# Patient Record
Sex: Female | Born: 1975 | Race: Black or African American | Hispanic: No | Marital: Single | Smoking: Never smoker
Health system: Southern US, Community
[De-identification: ages and names within clinical notes are randomized; demographics above are authoritative.]

## PROBLEM LIST (undated history)

## (undated) DIAGNOSIS — G4486 Cervicogenic headache: Secondary | ICD-10-CM

## (undated) DIAGNOSIS — F419 Anxiety disorder, unspecified: Secondary | ICD-10-CM

## (undated) DIAGNOSIS — K219 Gastro-esophageal reflux disease without esophagitis: Secondary | ICD-10-CM

## (undated) DIAGNOSIS — T7840XA Allergy, unspecified, initial encounter: Secondary | ICD-10-CM

## (undated) DIAGNOSIS — D649 Anemia, unspecified: Secondary | ICD-10-CM

## (undated) DIAGNOSIS — S134XXA Sprain of ligaments of cervical spine, initial encounter: Secondary | ICD-10-CM

## (undated) DIAGNOSIS — R51 Headache: Secondary | ICD-10-CM

## (undated) HISTORY — DX: Headache: R51

## (undated) HISTORY — DX: Allergy, unspecified, initial encounter: T78.40XA

## (undated) HISTORY — DX: Gastro-esophageal reflux disease without esophagitis: K21.9

## (undated) HISTORY — PX: MOUTH SURGERY: SHX715

## (undated) HISTORY — DX: Cervicogenic headache: G44.86

## (undated) HISTORY — DX: Anemia, unspecified: D64.9

## (undated) HISTORY — DX: Anxiety disorder, unspecified: F41.9

## (undated) HISTORY — DX: Sprain of ligaments of cervical spine, initial encounter: S13.4XXA

---

## 1999-08-19 ENCOUNTER — Other Ambulatory Visit: Admission: RE | Admit: 1999-08-19 | Discharge: 1999-08-19 | Payer: Self-pay | Admitting: Obstetrics

## 2008-03-23 ENCOUNTER — Emergency Department (HOSPITAL_COMMUNITY): Admission: EM | Admit: 2008-03-23 | Discharge: 2008-03-23 | Payer: Self-pay | Admitting: Family Medicine

## 2011-05-15 ENCOUNTER — Emergency Department (HOSPITAL_COMMUNITY)
Admission: EM | Admit: 2011-05-15 | Discharge: 2011-05-15 | Disposition: A | Discharge status: Home / Self Care | Payer: No Typology Code available for payment source | Attending: Emergency Medicine | Admitting: Emergency Medicine

## 2011-05-15 ENCOUNTER — Encounter (HOSPITAL_COMMUNITY): Payer: Self-pay | Admitting: Emergency Medicine

## 2011-05-15 DIAGNOSIS — T148XXA Other injury of unspecified body region, initial encounter: Secondary | ICD-10-CM

## 2011-05-15 DIAGNOSIS — S139XXA Sprain of joints and ligaments of unspecified parts of neck, initial encounter: Secondary | ICD-10-CM | POA: Insufficient documentation

## 2011-05-15 DIAGNOSIS — Y93I9 Activity, other involving external motion: Secondary | ICD-10-CM | POA: Insufficient documentation

## 2011-05-15 DIAGNOSIS — Y998 Other external cause status: Secondary | ICD-10-CM | POA: Insufficient documentation

## 2011-05-15 MED ORDER — IBUPROFEN 800 MG PO TABS: 800.0000 mg | ORAL_TABLET | Freq: Three times a day (TID) | ORAL | Status: AC

## 2011-05-15 MED ORDER — CYCLOBENZAPRINE HCL 10 MG PO TABS: 10.0000 mg | ORAL_TABLET | Freq: Two times a day (BID) | ORAL | Status: AC | PRN
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
## 2011-05-15 NOTE — Discharge Instructions (Signed)
FOLLOW UP WITH YOUR DOCTOR OR RETURN HERE IF PAIN IS NO BETTER IN 4-5 DAYS. COOL COMPRESSES TO THE SORE AREAS. TAKE MEDICATIONS AS PRESCRIBED. RETURN HERE WITH WORSENING SYMPTOMS OR NEW CONCERNS.  Cryotherapy Cryotherapy means treatment with cold. Ice or gel packs can be used to reduce both pain and swelling. Ice is the most helpful within the first 24 to 48 hours after an injury or flareup from overusing a muscle or joint. Sprains, strains, spasms, burning pain, shooting pain, and aches can all be eased with ice. Ice can also be used when recovering from surgery. Ice is effective, has very few side effects, and is safe for most people to use. PRECAUTIONS  Ice is not a safe treatment option for people with:  Raynaud's phenomenon. This is a condition affecting small blood vessels in the extremities. Exposure to cold may cause your problems to return.   Cold hypersensitivity. There are many forms of cold hypersensitivity, including:   Cold urticaria. Red, itchy hives appear on the skin when the tissues begin to warm after being iced.   Cold erythema. This is a red, itchy rash caused by exposure to cold.   Cold hemoglobinuria. Red blood cells break down when the tissues begin to warm after being iced. The hemoglobin that carry oxygen are passed into the urine because they cannot combine with blood proteins fast enough.   Numbness or altered sensitivity in the area being iced.  If you have any of the following conditions, do not use ice until you have discussed cryotherapy with your caregiver:  Heart conditions, such as arrhythmia, angina, or chronic heart disease.   High blood pressure.   Healing wounds or open skin in the area being iced.   Current infections.   Rheumatoid arthritis.   Poor circulation.   Diabetes.  Ice slows the blood flow in the region it is applied. This is beneficial when trying to stop inflamed tissues from spreading irritating chemicals to surrounding tissues.  However, if you expose your skin to cold temperatures for too long or without the proper protection, you can damage your skin or nerves. Watch for signs of skin damage due to cold. HOME CARE INSTRUCTIONS Follow these tips to use ice and cold packs safely.  Place a dry or damp towel between the ice and skin. A damp towel will cool the skin more quickly, so you may need to shorten the time that the ice is used.   For a more rapid response, add gentle compression to the ice.   Ice for no more than 10 to 20 minutes at a time. The bonier the area you are icing, the less time it will take to get the benefits of ice.   Check your skin after 5 minutes to make sure there are no signs of a poor response to cold or skin damage.   Rest 20 minutes or more in between uses.   Once your skin is numb, you can end your treatment. You can test numbness by very lightly touching your skin. The touch should be so light that you do not see the skin dimple from the pressure of your fingertip. When using ice, most people will feel these normal sensations in this order: cold, burning, aching, and numbness.   Do not use ice on someone who cannot communicate their responses to pain, such as small children or people with dementia.  HOW TO MAKE AN ICE PACK Ice packs are the most common way to use ice  therapy. Other methods include ice massage, ice baths, and cryo-sprays. Muscle creams that cause a cold, tingly feeling do not offer the same benefits that ice offers and should not be used as a substitute unless recommended by your caregiver. To make an ice pack, do one of the following:  Place crushed ice or a bag of frozen vegetables in a sealable plastic bag. Squeeze out the excess air. Place this bag inside another plastic bag. Slide the bag into a pillowcase or place a damp towel between your skin and the bag.   Mix 3 parts water with 1 part rubbing alcohol. Freeze the mixture in a sealable plastic bag. When you remove  the mixture from the freezer, it will be slushy. Squeeze out the excess air. Place this bag inside another plastic bag. Slide the bag into a pillowcase or place a damp towel between your skin and the bag.  SEEK MEDICAL CARE IF:  You develop white spots on your skin. This may give the skin a blotchy (mottled) appearance.   Your skin turns blue or pale.   Your skin becomes waxy or hard.   Your swelling gets worse.  MAKE SURE YOU:   Understand these instructions.   Will watch your condition.   Will get help right away if you are not doing well or get worse.  Document Released: 08/28/2010 Document Revised: 12/21/2010 Document Reviewed: 08/28/2010 [facility] Patient Information 2012 [place], [place].  Motor Vehicle Collision After a car crash (motor vehicle collision), it is normal to have bruises and sore muscles. The first 24 hours usually feel the worst. After that, you will likely start to feel better each day. HOME CARE  Put ice on the injured area.   Put ice in a plastic bag.   Place a towel between your skin and the bag.   Leave the ice on for 15 to 20 minutes, 3 to 4 times a day.   Drink enough fluids to keep your pee (urine) clear or pale yellow.   Do not drink alcohol.   Take a warm shower or bath 1 or 2 times a day. This helps your sore muscles.   Return to activities as told by your doctor. Be careful when lifting. Lifting can make neck or back pain worse.   Only take medicine as told by your doctor. Do not use aspirin.  GET HELP RIGHT AWAY IF:   Your arms or legs tingle, feel weak, or lose feeling (numbness).   You have headaches that do not get better with medicine.   You have neck pain, especially in the middle of the back of your neck.   You cannot control when you pee (urinate) or poop (bowel movement).   Pain is getting worse in any part of your body.   You are short of breath, dizzy, or pass out (faint).   You have chest pain.   You feel sick to  your stomach (nauseous), throw up (vomit), or sweat.   You have belly (abdominal) pain that gets worse.   There is blood in your pee, poop, or throw up.   You have pain in your shoulder (shoulder strap areas).   Your problems are getting worse.  MAKE SURE YOU:   Understand these instructions.   Will watch your condition.   Will get help right away if you are not doing well or get worse.  Document Released: 06/20/2007 Document Revised: 12/21/2010 Document Reviewed: 05/31/2010 [facility] Patient Information 2012 [place], [place].  Muscle Strain  A muscle strain (pulled muscle) happens when a muscle is over-stretched. Recovery usually takes 5 to 6 weeks.  HOME CARE   Put ice on the injured area.   Put ice in a plastic bag.   Place a towel between your skin and the bag.   Leave the ice on for 15 to 20 minutes at a time, every hour for the first 2 days.   Do not use the muscle for several days or until your doctor says you can. Do not use the muscle if you have pain.   Wrap the injured area with an elastic bandage for comfort. Do not put it on too tightly.   Only take medicine as told by your doctor.   Warm up before exercise. This helps prevent muscle strains.  GET HELP RIGHT AWAY IF:  There is increased pain or puffiness (swelling) in the affected area. MAKE SURE YOU:   Understand these instructions.   Will watch your condition.   Will get help right away if you are not doing well or get worse.  Document Released: 10/11/2007 Document Revised: 12/21/2010 Document Reviewed: 10/11/2007 [facility] Patient Information 2012 [place], [place].

## 2011-05-15 NOTE — ED Provider Notes (Signed)
Medical screening examination/treatment/procedure(s) were performed by non-physician practitioner and as supervising physician I was immediately available for consultation/collaboration.  Nat Christen, MD 05/15/11 [phone]

## 2011-05-21 ENCOUNTER — Encounter (HOSPITAL_COMMUNITY): Payer: Self-pay | Admitting: *Deleted

## 2011-05-21 ENCOUNTER — Emergency Department (HOSPITAL_COMMUNITY)
Admission: EM | Admit: 2011-05-21 | Discharge: 2011-05-21 | Disposition: A | Discharge status: Home / Self Care | Payer: No Typology Code available for payment source | Attending: Emergency Medicine | Admitting: Emergency Medicine

## 2011-05-21 DIAGNOSIS — R51 Headache: Secondary | ICD-10-CM

## 2011-05-21 DIAGNOSIS — M62838 Other muscle spasm: Secondary | ICD-10-CM

## 2011-05-21 MED ORDER — HYDROCODONE-ACETAMINOPHEN 5-325 MG PO TABS: 1.0000 | ORAL_TABLET | ORAL | Status: AC | PRN

## 2011-05-21 MED ORDER — ORPHENADRINE CITRATE ER 100 MG PO TB12: 100.0000 mg | ORAL_TABLET | Freq: Two times a day (BID) | ORAL | Status: AC

## 2011-05-21 NOTE — Discharge Instructions (Signed)
Please follow up with your primary care provider.  Use the new medications as directed.  If you are not feeling relief after 2-3 days, please see your doctor for reevaluation.  If you develop worsening pain, weakness or numbness of your extremities, loss of control of bowel or bladder, vomiting, inability to tolerate fluids by mouth, or difficulty walking, return to the ER immediately.  You may return to the ER at any time for worsening condition or any new symptoms that concern you.  Muscle Cramps Muscle cramps are due to sudden involuntary muscle contraction. This means you have no control over the tightening of a muscle (or muscles). Often there are no obvious causes. Muscle cramps may occur with overexertion. They may also occur with chilling of the muscles. An example of a muscle chilling activity is swimming. It is uncommon for cramps to be due to a serious underlying disorder. In most cases, muscle cramps improve (or leave) within minutes. CAUSES  Some common causes are:  Injury.   Infections, especially viral.   Abnormal levels of the salts and ions in your blood (electrolytes). This could happen if you are taking water pills (diuretics).   Blood vessel disease where not enough blood is getting to the muscles (intermittent claudication).  Some uncommon causes are:  Side effects of some medicine (such as lithium).   Alcohol abuse.   Diseases where there is soreness (inflammation) of the muscular system.  HOME CARE INSTRUCTIONS   It may be helpful to massage, stretch, and relax the affected muscle.  Taking a dose of over-the-counter diphenhydramine is helpful for night leg cramps.     Headache Headaches are caused by many different problems. Most commonly, headache is caused by muscle tension from an injury, fatigue, or emotional upset. Excessive muscle contractions in the scalp and neck result in a headache that often feels like a tight band around the head. Tension headaches  often have areas of tenderness over the scalp and the back of the neck. These headaches may last for hours, days, or longer, and some may contribute to migraines in those who have migraine problems. Migraines usually cause a throbbing headache, which is made worse by activity. Sometimes only one side of the head hurts. Nausea, vomiting, eye pain, and avoidance of food are common with migraines. Visual symptoms such as light sensitivity, blind spots, or flashing lights may also occur. Loud noises may worsen migraine headaches. Many factors may cause migraine headaches:  Emotional stress, lack of sleep, and menstrual periods.   Alcohol and some drugs (such as birth control pills).   Diet factors (fasting, caffeine, food preservatives, chocolate).   Environmental factors (weather changes, bright lights, odors, smoke).  Other causes of headaches include minor injuries to the head. Arthritis in the neck; problems with the jaw, eyes, ears, or nose are also causes of headaches. Allergies, drugs, alcohol, and exposure to smoke can also cause moderate headaches. Rebound headaches can occur if someone uses pain medications for a long period of time and then stops. Less commonly, blood vessel problems in the neck and brain (including stroke) can cause various types of headache. Treatment of headaches includes medicines for pain and relaxation. Ice packs or heat applied to the back of the head and neck help some people. Massaging the shoulders, neck and scalp are often very useful. Relaxation techniques and stretching can help prevent these headaches. Avoid alcohol and cigarette smoking as these tend to make headaches worse. Please see your caregiver if your headache  is not better in 2 days.  SEEK IMMEDIATE MEDICAL CARE IF:   You develop a high fever, chills, or repeated vomiting.   You faint or have difficulty with vision.   You develop unusual numbness or weakness of your arms or legs.   Relief of pain is  inadequate with medication, or you develop severe pain.   You develop confusion, or neck stiffness.   You have a worsening of a headache or do not obtain relief.  Document Released: 01/01/2005 Document Revised: 12/21/2010 Document Reviewed: 06/27/2006  [facility] Patient Information 2012 [place], [place]. SEEK MEDICAL CARE IF:  Cramps are frequent and not relieved with medicine. MAKE SURE YOU:   Understand these instructions.   Will watch your condition.   Will get help right away if you are not doing well or get worse.  Document Released: 06/23/2001 Document Revised: 12/21/2010 Document Reviewed: 12/24/2007 [facility] Patient Information 2012 [place], [place].Muscle Cramps Muscle cramps are when muscles tighten by themselves. Muscle cramps usually improve or go away within minutes. HOME CARE  Massage the muscle.   Stretch the muscle.   Relax the muscle.   Only take medicine as told by your doctor.   Drink enough fluids to keep your pee (urine) clear or pale yellow.  GET HELP RIGHT AWAY IF:  Cramps are frequent and do not get better with medicine. MAKE SURE YOU:  Understand these intructions.   Will watch your condition.   Will get help right away if your are not doing well or get worse.  Document Released: 12/15/2007 Document Revised: 12/21/2010 Document Reviewed: 12/24/2007 [facility] Patient Information 2012 [place], [place].

## 2011-05-21 NOTE — ED Provider Notes (Signed)
Medical screening examination/treatment/procedure(s) were performed by non-physician practitioner and as supervising physician I was immediately available for consultation/collaboration.  Bostyn Kunkler M Ossie Yebra, MD 05/21/11 2333 
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
## 2011-05-24 ENCOUNTER — Encounter (HOSPITAL_COMMUNITY): Payer: Self-pay | Admitting: Family Medicine

## 2011-05-24 ENCOUNTER — Emergency Department (HOSPITAL_COMMUNITY)
Admission: EM | Admit: 2011-05-24 | Discharge: 2011-05-24 | Disposition: A | Discharge status: Home / Self Care | Payer: No Typology Code available for payment source | Attending: Emergency Medicine | Admitting: Emergency Medicine

## 2011-05-24 DIAGNOSIS — G5602 Carpal tunnel syndrome, left upper limb: Secondary | ICD-10-CM

## 2011-05-24 DIAGNOSIS — G5600 Carpal tunnel syndrome, unspecified upper limb: Secondary | ICD-10-CM | POA: Insufficient documentation

## 2011-05-24 DIAGNOSIS — R209 Unspecified disturbances of skin sensation: Secondary | ICD-10-CM | POA: Insufficient documentation

## 2011-05-24 DIAGNOSIS — R51 Headache: Secondary | ICD-10-CM | POA: Insufficient documentation

## 2011-05-24 MED ORDER — MORPHINE SULFATE 4 MG/ML IJ SOLN
4.0000 mg | Freq: Once | INTRAMUSCULAR | Status: AC
  Administered 2011-05-24: 4 mg via INTRAVENOUS
  Filled 2011-05-24: qty 1
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
## 2011-06-07 ENCOUNTER — Ambulatory Visit
Admission: RE | Admit: 2011-06-07 | Discharge: 2011-06-07 | Disposition: A | Discharge status: Home / Self Care | Payer: Self-pay | Source: Ambulatory Visit | Attending: Neurology | Admitting: Neurology

## 2011-06-07 ENCOUNTER — Other Ambulatory Visit: Payer: Self-pay | Admitting: Neurology

## 2011-06-07 DIAGNOSIS — R51 Headache: Secondary | ICD-10-CM

## 2011-06-07 DIAGNOSIS — M542 Cervicalgia: Secondary | ICD-10-CM

## 2011-06-18 ENCOUNTER — Ambulatory Visit: Payer: No Typology Code available for payment source | Attending: Neurology | Admitting: Physical Therapy

## 2011-06-18 DIAGNOSIS — R5381 Other malaise: Secondary | ICD-10-CM | POA: Insufficient documentation

## 2011-06-18 DIAGNOSIS — M2550 Pain in unspecified joint: Secondary | ICD-10-CM | POA: Insufficient documentation

## 2011-06-18 DIAGNOSIS — IMO0001 Reserved for inherently not codable concepts without codable children: Secondary | ICD-10-CM | POA: Insufficient documentation

## 2011-06-18 DIAGNOSIS — M2560 Stiffness of unspecified joint, not elsewhere classified: Secondary | ICD-10-CM | POA: Insufficient documentation

## 2011-06-19 ENCOUNTER — Ambulatory Visit: Payer: No Typology Code available for payment source | Admitting: Physical Therapy

## 2011-06-26 ENCOUNTER — Ambulatory Visit: Payer: No Typology Code available for payment source

## 2011-06-29 ENCOUNTER — Ambulatory Visit: Payer: No Typology Code available for payment source

## 2011-07-05 ENCOUNTER — Ambulatory Visit: Payer: No Typology Code available for payment source

## 2011-07-06 ENCOUNTER — Ambulatory Visit: Payer: No Typology Code available for payment source

## 2011-07-12 ENCOUNTER — Ambulatory Visit: Payer: No Typology Code available for payment source

## 2011-07-13 ENCOUNTER — Ambulatory Visit: Payer: No Typology Code available for payment source

## 2011-07-26 ENCOUNTER — Ambulatory Visit: Payer: No Typology Code available for payment source | Attending: Neurology

## 2011-07-26 DIAGNOSIS — IMO0001 Reserved for inherently not codable concepts without codable children: Secondary | ICD-10-CM | POA: Insufficient documentation

## 2011-07-26 DIAGNOSIS — M2560 Stiffness of unspecified joint, not elsewhere classified: Secondary | ICD-10-CM | POA: Insufficient documentation

## 2011-07-26 DIAGNOSIS — M2550 Pain in unspecified joint: Secondary | ICD-10-CM | POA: Insufficient documentation

## 2011-07-26 DIAGNOSIS — R5381 Other malaise: Secondary | ICD-10-CM | POA: Insufficient documentation

## 2011-07-27 ENCOUNTER — Ambulatory Visit: Payer: No Typology Code available for payment source

## 2011-08-02 ENCOUNTER — Ambulatory Visit: Payer: No Typology Code available for payment source

## 2011-08-03 ENCOUNTER — Ambulatory Visit: Payer: No Typology Code available for payment source

## 2011-08-10 ENCOUNTER — Ambulatory Visit: Payer: No Typology Code available for payment source

## 2011-08-16 ENCOUNTER — Ambulatory Visit: Payer: No Typology Code available for payment source | Attending: Neurology | Admitting: Rehabilitation

## 2011-08-16 DIAGNOSIS — M2560 Stiffness of unspecified joint, not elsewhere classified: Secondary | ICD-10-CM | POA: Insufficient documentation

## 2011-08-16 DIAGNOSIS — IMO0001 Reserved for inherently not codable concepts without codable children: Secondary | ICD-10-CM | POA: Insufficient documentation

## 2011-08-16 DIAGNOSIS — M2550 Pain in unspecified joint: Secondary | ICD-10-CM | POA: Insufficient documentation

## 2011-08-16 DIAGNOSIS — R5381 Other malaise: Secondary | ICD-10-CM | POA: Insufficient documentation

## 2011-08-17 ENCOUNTER — Ambulatory Visit: Payer: No Typology Code available for payment source | Admitting: Rehabilitation

## 2011-08-17 ENCOUNTER — Encounter: Payer: Self-pay | Admitting: Rehabilitation

## 2011-08-23 ENCOUNTER — Ambulatory Visit: Payer: No Typology Code available for payment source

## 2011-08-30 ENCOUNTER — Ambulatory Visit: Payer: No Typology Code available for payment source

## 2011-08-31 ENCOUNTER — Ambulatory Visit: Payer: No Typology Code available for payment source

## 2012-05-07 ENCOUNTER — Encounter: Payer: Self-pay | Admitting: Nurse Practitioner

## 2012-05-07 ENCOUNTER — Ambulatory Visit (INDEPENDENT_AMBULATORY_CARE_PROVIDER_SITE_OTHER): Payer: BC Managed Care – PPO | Admitting: Nurse Practitioner

## 2012-05-07 VITALS — BP 113/68 | HR 109 | Ht 69.5 in | Wt 152.0 lb

## 2012-05-07 DIAGNOSIS — S139XXA Sprain of joints and ligaments of unspecified parts of neck, initial encounter: Secondary | ICD-10-CM | POA: Insufficient documentation

## 2012-05-07 DIAGNOSIS — Z5189 Encounter for other specified aftercare: Secondary | ICD-10-CM

## 2012-05-07 DIAGNOSIS — R51 Headache: Secondary | ICD-10-CM

## 2012-05-07 DIAGNOSIS — S139XXD Sprain of joints and ligaments of unspecified parts of neck, subsequent encounter: Secondary | ICD-10-CM

## 2012-05-07 DIAGNOSIS — R519 Headache, unspecified: Secondary | ICD-10-CM | POA: Insufficient documentation
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
## 2012-05-07 NOTE — Patient Instructions (Addendum)
Taper nortriptyline by 1 tablet for the next month and then stop the medication. If headaches return go back to previous dose. Continue exercise and HEP program Followup when necessary

## 2012-05-07 NOTE — Progress Notes (Signed)
I have read the note, and I agree with the clinical assessment and plan.  

## 2012-06-17 ENCOUNTER — Telehealth: Payer: Self-pay | Admitting: Nurse Practitioner

## 2012-06-17 NOTE — Telephone Encounter (Signed)
Headache has returned and patient would like to speak with Darrol Angel concerning medication.

## 2012-06-17 NOTE — Telephone Encounter (Addendum)
TC to pt. She has started back on low dose Amitriptyline and restarted her dose as I had instructed. She does not need refills at present. Headches are already improved.

## 2012-06-19 ENCOUNTER — Telehealth: Payer: Self-pay | Admitting: Nurse Practitioner

## 2012-06-19 NOTE — Telephone Encounter (Signed)
Pt called would like to is there another treatment for her to try besides what she is taking and does she need to do a f/u with the medication (nortriptyline (PAMELOR) 10 MG capsule) for her headaches, she also states that you can leave a detail message on her vm in case she is at work. Thanks

## 2012-06-19 NOTE — Telephone Encounter (Signed)
Patient called wanting to speak with CM about another treatment for her headaches.

## 2012-06-20 ENCOUNTER — Telehealth: Payer: Self-pay | Admitting: Nurse Practitioner

## 2012-06-20 MED ORDER — TIZANIDINE HCL 4 MG PO CAPS: 4.0000 mg | ORAL_CAPSULE | Freq: Every day | ORAL | Status: DC

## 2012-06-20 NOTE — Telephone Encounter (Signed)
Called patient at work number, had to leave a message will order Tizanidine for cervical headaches to her pharmacy. I am not sure why she decided not to refill amitriptyline.

## 2012-06-20 NOTE — Telephone Encounter (Signed)
I have already called and left a message for this pt. I have ordered a different medication for her to take at night. It has been called in. I do not want her to take the Amitriptyline with this. She can decide one or the other not both.

## 2012-06-20 NOTE — Telephone Encounter (Signed)
Per Renee Pain note patient called stating she hasn't stop taking nortriptyline and wants to know if there's another medication.

## 2012-06-20 NOTE — Telephone Encounter (Signed)
Patient is calling back to tell us she has not stopped her Norotriptyline, but would like to know if thee's another medication which would help her better?  She would like to speak someone.  You can leave a voice mail on her cell phone at [phone]-this is her cell phone but she can't take calls at work.

## 2012-07-07 NOTE — Telephone Encounter (Signed)
Called pt, no answer. Left vmail.

## 2012-07-08 ENCOUNTER — Telehealth: Payer: Self-pay

## 2012-07-08 NOTE — Telephone Encounter (Signed)
Patient returned vmail message I left last night. Patient was unaware of message NP-Carolyn Daphine Deutscher left on 06/20/12. Says does understand and will follow NP advice. Will c/b if any other questions or concerns.

## 2012-07-31 ENCOUNTER — Ambulatory Visit (INDEPENDENT_AMBULATORY_CARE_PROVIDER_SITE_OTHER): Payer: BC Managed Care – PPO | Admitting: Family Medicine

## 2012-07-31 VITALS — BP 110/60 | HR 91 | Temp 98.8°F | Resp 16 | Ht 70.0 in | Wt 153.0 lb

## 2012-07-31 DIAGNOSIS — J309 Allergic rhinitis, unspecified: Secondary | ICD-10-CM

## 2012-07-31 DIAGNOSIS — J0100 Acute maxillary sinusitis, unspecified: Secondary | ICD-10-CM

## 2012-07-31 MED ORDER — FLUTICASONE PROPIONATE 50 MCG/ACT NA SUSP: 2.0000 | Freq: Every day | NASAL | Status: DC

## 2012-07-31 MED ORDER — AMOXICILLIN-POT CLAVULANATE 875-125 MG PO TABS: 1.0000 | ORAL_TABLET | Freq: Two times a day (BID) | ORAL | Status: DC
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
## 2012-07-31 NOTE — Patient Instructions (Signed)
Allergic Rhinitis Allergic rhinitis is when the mucous membranes in the nose respond to allergens. Allergens are particles in the air that cause your body to have an allergic reaction. This causes you to release allergic antibodies. Through a chain of events, these eventually cause you to release histamine into the blood stream (hence the use of antihistamines). Although meant to be protective to the body, it is this release that causes your discomfort, such as frequent sneezing, congestion and an itchy runny nose.  CAUSES  The pollen allergens may come from grasses, trees, and weeds. This is seasonal allergic rhinitis, or "hay fever." Other allergens cause year-round allergic rhinitis (perennial allergic rhinitis) such as house dust mite allergen, pet dander and mold spores.  SYMPTOMS  Nasal stuffiness (congestion). Runny, itchy nose with sneezing and tearing of the eyes. There is often an itching of the mouth, eyes and ears. It cannot be cured, but it can be controlled with medications. DIAGNOSIS  If you are unable to determine the offending allergen, skin or blood testing may find it. TREATMENT  Avoid the allergen. Medications and allergy shots (immunotherapy) can help. Hay fever may often be treated with antihistamines in pill or nasal spray forms. Antihistamines block the effects of histamine. There are over-the-counter medicines that may help with nasal congestion and swelling around the eyes. Check with your caregiver before taking or giving this medicine. If the treatment above does not work, there are many new medications your caregiver can prescribe. Stronger medications may be used if initial measures are ineffective. Desensitizing injections can be used if medications and avoidance fails. Desensitization is when a patient is given ongoing shots until the body becomes less sensitive to the allergen. Make sure you follow up with your caregiver if problems continue. SEEK MEDICAL CARE IF:    You develop fever (more than 100.5 F (38.1 C). You develop a cough that does not stop easily (persistent). You have shortness of breath. You start wheezing. Symptoms interfere with normal daily activities. Document Released: 09/26/2000 Document Revised: 03/26/2011 Document Reviewed: 04/07/2008 [facility] Patient Information 2014 [place], [place].  Decreased sense of smell or taste. DIAGNOSIS Your caregiver will perform a physical exam and ask questions about the symptoms you are having.Allergy testing may be done to determine exactly what triggers your hay fever.  TREATMENT   Over-the-counter medicines may help symptoms. These include:  Antihistamines.  Decongestants. These may help with nasal congestion.  Your caregiver may prescribe medicines if over-the-counter medicines do not work.  Some people benefit from allergy shots when other medicines are not helpful. HOME CARE INSTRUCTIONS   Avoid the allergen that is causing your symptoms, if possible.  Take all medicine as told by your caregiver. SEEK MEDICAL CARE IF:   You have severe allergy symptoms and your current medicines are not helping.  Your treatment was working at one time, but you are now experiencing symptoms.  You have sinus congestion and pressure.  You develop a fever or headache.  You have thick nasal discharge.  You have asthma and have a worsening cough and wheezing. SEEK IMMEDIATE MEDICAL CARE IF:   You have swelling of your tongue or lips.  You have trouble breathing.  You feel lightheaded or like you are going to faint.  You have cold sweats.  You have a fever. Document Released: 01/01/2005 Document Revised: 03/26/2011 Document Reviewed: 03/29/2010 [facility] Patient Information 2014 [place], [place]. Sinusitis Sinusitis is redness, soreness, and swelling (inflammation) of the paranasal sinuses. Paranasal sinuses are air pockets  within the bones of your face (beneath the eyes, the middle of  the forehead, or above the eyes). In healthy paranasal sinuses, mucus is able to drain out, and air is able to circulate through them by way of your nose. However, when your paranasal sinuses are inflamed, mucus and air can become trapped. This can allow bacteria and other germs to grow and cause infection. Sinusitis can develop quickly and last only a short time (acute) or continue over a long period (chronic). Sinusitis that lasts for more than 12 weeks is considered chronic.  CAUSES  Causes of sinusitis include:  Allergies.  Structural abnormalities, such as displacement of the cartilage that separates your nostrils (deviated septum), which can decrease the air flow through your nose and sinuses and affect sinus drainage.  Functional abnormalities, such as when the small hairs (cilia) that line your sinuses and help remove mucus do not work properly or are not present. SYMPTOMS  Symptoms of acute and chronic sinusitis are the same. The primary symptoms are pain and pressure around the affected sinuses. Other symptoms include:  Upper toothache.  Earache.  Headache.  Bad breath.  Decreased sense of smell and taste.  A cough, which worsens when you are lying flat.  Fatigue.  Fever.  Thick drainage from your nose, which often is green and may contain pus (purulent).  Swelling and warmth over the affected sinuses. DIAGNOSIS  Your caregiver will perform a physical exam. During the exam, your caregiver may:  Look in your nose for signs of abnormal growths in your nostrils (nasal polyps).  Tap over the affected sinus to check for signs of infection.  View the inside of your sinuses (endoscopy) with a special imaging device with a light attached (endoscope), which is inserted into your sinuses. If your caregiver suspects that you have chronic sinusitis, one or more of the following tests may be recommended:  Allergy tests.  Nasal culture A sample of mucus is taken from your nose  and sent to a lab and screened for bacteria.  Nasal cytology A sample of mucus is taken from your nose and examined by your caregiver to determine if your sinusitis is related to an allergy. TREATMENT  Most cases of acute sinusitis are related to a viral infection and will resolve on their own within 10 days. Sometimes medicines are prescribed to help relieve symptoms (pain medicine, decongestants, nasal steroid sprays, or saline sprays).  However, for sinusitis related to a bacterial infection, your caregiver will prescribe antibiotic medicines. These are medicines that will help kill the bacteria causing the infection.  Rarely, sinusitis is caused by a fungal infection. In theses cases, your caregiver will prescribe antifungal medicine. For some cases of chronic sinusitis, surgery is needed. Generally, these are cases in which sinusitis recurs more than 3 times per year, despite other treatments. HOME CARE INSTRUCTIONS   Drink plenty of water. Water helps thin the mucus so your sinuses can drain more easily.  Use a humidifier.  Inhale steam 3 to 4 times a day (for example, sit in the bathroom with the shower running).  Apply a warm, moist washcloth to your face 3 to 4 times a day, or as directed by your caregiver.  Use saline nasal sprays to help moisten and clean your sinuses.  Take over-the-counter or prescription medicines for pain, discomfort, or fever only as directed by your caregiver. SEEK IMMEDIATE MEDICAL CARE IF:  You have increasing pain or severe headaches.  You have nausea, vomiting, or  drowsiness.  You have swelling around your face.  You have vision problems.  You have a stiff neck.  You have difficulty breathing. MAKE SURE YOU:   Understand these instructions.  Will watch your condition.  Will get help right away if you are not doing well or get worse. Document Released: 01/01/2005 Document Revised: 03/26/2011 Document Reviewed: 01/16/2011 [facility] Patient  Information 2014 [place], [place].

## 2012-08-14 ENCOUNTER — Telehealth: Payer: Self-pay

## 2012-08-14 DIAGNOSIS — J019 Acute sinusitis, unspecified: Secondary | ICD-10-CM

## 2012-08-14 NOTE — Telephone Encounter (Signed)
Dr. Conley Rolls  Patient stopped taking her antibotics b/c of diahrrea.  Would like another different antibotic.  [facility] [address]    [phone]

## 2012-08-15 MED ORDER — AZITHROMYCIN 250 MG PO TABS: ORAL_TABLET | ORAL | Status: DC

## 2012-08-15 NOTE — Telephone Encounter (Signed)
Patient advised.

## 2012-10-15 ENCOUNTER — Telehealth: Payer: Self-pay | Admitting: Nurse Practitioner

## 2012-10-15 NOTE — Telephone Encounter (Signed)
I am glad the Tizanidine worked. She needs to f/u yearly to get refills or is she planning on having PCP do this.

## 2012-10-21 NOTE — Telephone Encounter (Signed)
I called and LMVM for pt that she may get pcp to refill and then would not need to see [place] or can see [place] yearly for revisit and then we would continue to refill. It is up to her.  Pt to call us back if needed.

## 2012-10-28 ENCOUNTER — Ambulatory Visit: Payer: BC Managed Care – PPO

## 2012-10-28 ENCOUNTER — Ambulatory Visit (INDEPENDENT_AMBULATORY_CARE_PROVIDER_SITE_OTHER): Payer: BC Managed Care – PPO | Admitting: Family Medicine

## 2012-10-28 VITALS — BP 122/78 | HR 91 | Temp 99.0°F | Resp 17 | Ht 70.0 in | Wt 150.0 lb

## 2012-10-28 DIAGNOSIS — M25512 Pain in left shoulder: Secondary | ICD-10-CM

## 2012-10-28 DIAGNOSIS — S46012A Strain of muscle(s) and tendon(s) of the rotator cuff of left shoulder, initial encounter: Secondary | ICD-10-CM

## 2012-10-28 DIAGNOSIS — S43429A Sprain of unspecified rotator cuff capsule, initial encounter: Secondary | ICD-10-CM

## 2012-10-28 DIAGNOSIS — M25519 Pain in unspecified shoulder: Secondary | ICD-10-CM

## 2012-10-28 NOTE — Patient Instructions (Signed)
Rotator Cuff Injury  The rotator cuff is the collective set of muscles and tendons that make up the stabilizing unit of your shoulder. This unit holds in the ball of the humerus (upper arm bone) in the socket of the scapula (shoulder blade). Injuries to this stabilizing unit most commonly come from sports or activities that cause the arm to be moved repeatedly over the head. Examples of this include throwing, weight lifting, swimming, racquet sports, or an injury such as falling on your arm. Chronic (longstanding) irritation of this unit can cause inflammation (soreness), bursitis, and eventual damage to the tendons to the point of rupture (tear). An acute (sudden) injury of the rotator cuff can result in a partial or complete tear. You may need surgery with complete tears. Small or partial rotator cuff tears may be treated conservatively with temporary immobilization, exercises and rest. Physical therapy may be needed.  HOME CARE INSTRUCTIONS   · Apply ice to the injury for 15-20 minutes 3-4 times per day for the first 2 days. Put the ice in a plastic bag and place a towel between the bag of ice and your skin.  · If you have a shoulder immobilizer (sling and straps), do not remove it for as long as directed by your caregiver or until you see a caregiver for a follow-up examination. If you need to remove it, move your arm as little as possible.  · You may want to sleep on several pillows or in a recliner at night to lessen swelling and pain.  · Only take over-the-counter or prescription medicines for pain, discomfort, or fever as directed by your caregiver.  · Do simple hand squeezing exercises with a soft rubber ball to decrease hand swelling.  SEEK MEDICAL CARE IF:   · Pain in your shoulder increases or new pain or numbness develops in your arm, hand, or fingers.  · Your hand or fingers are colder than your other hand.  SEEK IMMEDIATE MEDICAL CARE IF:   · Your arm, hand, or fingers are numb or tingling.  · Your  arm, hand, or fingers are increasingly swollen and painful, or turn white or blue.  Document Released: 12/30/1999 Document Revised: 03/26/2011 Document Reviewed: 12/23/2007  ExitCare® Patient Information ©2014 [employer] LLC.
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
## 2013-01-26 ENCOUNTER — Ambulatory Visit (INDEPENDENT_AMBULATORY_CARE_PROVIDER_SITE_OTHER): Payer: BC Managed Care – PPO | Admitting: Family Medicine

## 2013-01-26 VITALS — BP 112/74 | HR 84 | Temp 98.5°F | Resp 18 | Ht 71.5 in | Wt 147.0 lb

## 2013-01-26 DIAGNOSIS — R81 Glycosuria: Secondary | ICD-10-CM

## 2013-01-26 DIAGNOSIS — N390 Urinary tract infection, site not specified: Secondary | ICD-10-CM

## 2013-01-26 DIAGNOSIS — J329 Chronic sinusitis, unspecified: Secondary | ICD-10-CM

## 2013-01-26 DIAGNOSIS — R3915 Urgency of urination: Secondary | ICD-10-CM

## 2013-01-26 LAB — POCT URINALYSIS DIPSTICK
Glucose, UA: 250
Nitrite, UA: POSITIVE
Protein, UA: 100
Spec Grav, UA: <=1.005
Urobilinogen, UA: 4
pH, UA: 5

## 2013-01-26 LAB — POCT UA - MICROSCOPIC ONLY
Casts, Ur, LPF, POC: NEGATIVE
Crystals, Ur, HPF, POC: NEGATIVE
MUCUS UA: NEGATIVE
Yeast, UA: NEGATIVE

## 2013-01-26 LAB — GLUCOSE, POCT (MANUAL RESULT ENTRY): POC GLUCOSE: 82 mg/dL (ref 70–99)

## 2013-01-26 MED ORDER — LEVOFLOXACIN 500 MG PO TABS: 500.0000 mg | ORAL_TABLET | Freq: Every day | ORAL | Status: DC

## 2013-01-26 NOTE — Patient Instructions (Addendum)
Drink plenty of fluids. This will help your urinary problems as well as helping keep the secretions in your sinuses and chest looser.  Take the antibiotic one pill daily for one week  Return if worse

## 2013-01-26 NOTE — Progress Notes (Signed)
Subjective: Patient is here for a couple of issues. She has had a problem with her sinuses back in the fall when she was treated here. She did better for a while, but has been having a lot of trouble with facial and head pain, some postnasal drainage, and a cough in the mornings. She thinks her sinuses are still a problem. She does not smoke. 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  She has been having some urinary discomfort for the last couple of days with burning and frequency. She took some Azo yesterday to hold off the symptoms until she can come in today. It is been a long time since she's had a urinary tract infection. She has no history of diabetes.  Objective: Pleasant lady, healthy appearing, no acute distress. Her TMs are normal. Throat clear. Neck supple without significant nodes. No major tenderness of the facial sinus areas. Chest is clear to auscultation. Heart regular.   Results for orders placed in visit on 01/26/13  POCT UA - MICROSCOPIC ONLY      Result Value Range   WBC, Ur, HPF, POC TNTC     RBC, urine, microscopic 7-12     Bacteria, U Microscopic 3+     Mucus, UA neg     Epithelial cells, urine per micros 6-8     Crystals, Ur, HPF, POC neg     Casts, Ur, LPF, POC neg     Yeast, UA neg    POCT URINALYSIS DIPSTICK      Result Value Range   Color, UA orange/red     Clarity, UA turbid     Glucose, UA 250     Bilirubin, UA small     Ketones, UA trace     Spec Grav, UA <=1.005     Blood, UA small     pH, UA 5.0     Protein, UA 100     Urobilinogen, UA 4.0     Nitrite, UA positive     Leukocytes, UA large (3+)     Assessment: UTI Glucosuria Sinusitis and cough  Plan: Check her sugar Treat with Levaquin which should be good for both sinuses and urine Drink plenty of fluids Return if worse Results for orders placed in visit on 01/26/13  POCT UA - MICROSCOPIC ONLY      Result Value Range   WBC, Ur, HPF, POC TNTC     RBC, urine,  microscopic 7-12     Bacteria, U Microscopic 3+     Mucus, UA neg     Epithelial cells, urine per micros 6-8     Crystals, Ur, HPF, POC neg     Casts, Ur, LPF, POC neg     Yeast, UA neg    POCT URINALYSIS DIPSTICK      Result Value Range   Color, UA orange/red     Clarity, UA turbid     Glucose, UA 250     Bilirubin, UA small     Ketones, UA trace     Spec Grav, UA <=1.005     Blood, UA small     pH, UA 5.0     Protein, UA 100     Urobilinogen, UA 4.0     Nitrite, UA positive     Leukocytes, UA large (3+)    GLUCOSE, POCT (MANUAL RESULT ENTRY)      Result Value Range   POC Glucose 82  70 - 99 mg/dl

## 2013-01-28 LAB — URINE CULTURE

## 2013-01-30 ENCOUNTER — Telehealth: Payer: Self-pay

## 2013-01-30 NOTE — Telephone Encounter (Signed)
Pt called stating antibiotics is working and now she is having symptoms of a yeast infection. Pt will like for medication to be called in at [facility] at [facility] [address]

## 2013-02-02 ENCOUNTER — Telehealth: Payer: Self-pay

## 2013-02-02 MED ORDER — FLUCONAZOLE 150 MG PO TABS: 150.0000 mg | ORAL_TABLET | Freq: Once | ORAL | Status: DC

## 2013-02-02 NOTE — Telephone Encounter (Signed)
Sent Diflucan to the pharmacy.  

## 2013-02-02 NOTE — Telephone Encounter (Signed)
Patient states that the current antibiotic she is taking (Levofloxin) is not helping with her UTI symptoms. States that after she urinates she still has the urge to go. States that she would like a different medication called in if possible. [facility] [address].  [phone]

## 2013-02-03 NOTE — Telephone Encounter (Signed)
Advised pt rx at pharmacy 

## 2013-02-05 NOTE — Telephone Encounter (Signed)
Call: Should have responded well to levaquin.  Needs rechecked if still having symptoms

## 2013-02-06 NOTE — Telephone Encounter (Signed)
Spoke with patient and she is not having any urinary sxs. She thinks her sxs and irritation were from yeast infection she had. Both have cleared and she is doing well.

## 2013-03-25 ENCOUNTER — Encounter: Payer: Self-pay | Admitting: Physician Assistant

## 2013-03-25 ENCOUNTER — Ambulatory Visit (INDEPENDENT_AMBULATORY_CARE_PROVIDER_SITE_OTHER): Payer: BC Managed Care – PPO | Admitting: Physician Assistant

## 2013-03-25 VITALS — BP 110/68 | HR 100 | Temp 100.3°F | Resp 16 | Ht 69.0 in | Wt 141.0 lb

## 2013-03-25 DIAGNOSIS — R6889 Other general symptoms and signs: Secondary | ICD-10-CM

## 2013-03-25 DIAGNOSIS — R197 Diarrhea, unspecified: Secondary | ICD-10-CM

## 2013-03-25 LAB — POCT INFLUENZA A/B
INFLUENZA B, POC: NEGATIVE
Influenza A, POC: NEGATIVE

## 2013-03-25 MED ORDER — GUAIFENESIN ER 1200 MG PO TB12: 1.0000 | ORAL_TABLET | Freq: Two times a day (BID) | ORAL | Status: AC

## 2013-03-25 NOTE — Progress Notes (Signed)
   Subjective:    Patient ID: Laura Montgomery, female    DOB: 1975-07-04, 38 y.o.   MRN: [id number]  HPI Pt presents to clinic with feeling poorly for 2 days. Started on 2 days ago with sore throat and then yesterday she developed myalgias and worsening nasal congestion with PND.  She used a dose of Nyquil and it helped only a little.  She has been nauseated slightly but her appetite has been normal.  OTC meds - Nyquil Sick contacts - none Flu vaccine - no  Review of Systems  Constitutional: Positive for fever (subjective) and chills.  HENT: Positive for congestion, postnasal drip, rhinorrhea (yellow) and sore throat.   Respiratory: Positive for cough (rare - more like clearing her throat).   Gastrointestinal: Positive for nausea and diarrhea. Negative for vomiting.  Musculoskeletal: Positive for myalgias.  Neurological: Negative for headaches.       Objective:   Physical Exam  Vitals reviewed. Constitutional: She is oriented to person, place, and time. She appears well-developed and well-nourished.  HENT:  Head: Normocephalic and atraumatic.  Right Ear: Hearing, tympanic membrane, external ear and ear canal normal.  Left Ear: Hearing, tympanic membrane, external ear and ear canal normal.  Nose: Mucosal edema present.  Mouth/Throat: Uvula is midline, oropharynx is clear and moist and mucous membranes are normal.  Eyes: Conjunctivae are normal.  Neck: Normal range of motion.  Cardiovascular: Normal rate, regular rhythm and normal heart sounds.   No murmur heard. Pulmonary/Chest: Effort normal and breath sounds normal. She has no wheezes.  Lymphadenopathy:    She has no cervical adenopathy.  Neurological: She is alert and oriented to person, place, and time.  Skin: Skin is warm and dry.  Psychiatric: She has a normal mood and affect. Her behavior is normal. Judgment and thought content normal.   Results for orders placed in visit on 03/25/13  POCT INFLUENZA A/B      Result  Value Ref Range   Influenza A, POC Negative     Influenza B, POC Negative         Assessment & Plan:  Flu-like symptoms - Plan: POCT Influenza A/B, Guaifenesin (MUCINEX MAXIMUM STRENGTH) 1200 MG TB12  Diarrhea  Pt has a viral like illness and we will do symptomatic treatment at this time.  She will increase her fluids to help her mucus stay thin.  Windell Hummingbird PA-C 03/25/2013 7:11 PM

## 2013-03-25 NOTE — Patient Instructions (Signed)
Imodium for diarrhea.  Mucinex for your congestion

## 2013-04-02 ENCOUNTER — Telehealth: Payer: Self-pay

## 2013-04-02 MED ORDER — PROMETHAZINE-CODEINE 6.25-10 MG/5ML PO SYRP: 5.0000 mL | ORAL_SOLUTION | Freq: Four times a day (QID) | ORAL | Status: DC | PRN

## 2013-04-02 NOTE — Telephone Encounter (Signed)
I am more than happy to call her something in for her cough.  The mucinex is making her nose run and I am ok with that.  If she is not better in a week she should RTC - if she is having any SOB or difficulties breathing that is not associated with the cough she should RTC.  I have sent in Phenergan with CDN because the hydrocodone syrups have to be paper copy.

## 2013-04-02 NOTE — Telephone Encounter (Signed)
PATIENT STATES SHE SAW SARAH WEBER LAST WED. FOR FLU-LIKE SYMPTOMS. SHE WOULD LIKE SARAH TO KNOW THAT HER COUGH IS WORSE AT NIGHT. SHE CAN'T SLEEP. SHE IS ALSO BLOWING HER NOSE A LOT BUT SHE IS TAKING MUSCI NEX. SHE WANTS TO KNOW IF SARAH WANTS TO CALL HER SOMETHING INTO HER PHARMACY? BEST PHONE [phone] (CELL)  [place]

## 2013-04-02 NOTE — Telephone Encounter (Signed)
Pt.notified

## 2013-04-03 NOTE — Telephone Encounter (Signed)
faxed

## 2013-04-09 ENCOUNTER — Ambulatory Visit (INDEPENDENT_AMBULATORY_CARE_PROVIDER_SITE_OTHER): Payer: BC Managed Care – PPO | Admitting: Physician Assistant

## 2013-04-09 VITALS — BP 120/66 | HR 95 | Temp 98.9°F | Resp 18 | Ht 69.0 in | Wt 143.0 lb

## 2013-04-09 DIAGNOSIS — R059 Cough, unspecified: Secondary | ICD-10-CM

## 2013-04-09 DIAGNOSIS — J329 Chronic sinusitis, unspecified: Secondary | ICD-10-CM

## 2013-04-09 DIAGNOSIS — J019 Acute sinusitis, unspecified: Secondary | ICD-10-CM

## 2013-04-09 DIAGNOSIS — R05 Cough: Secondary | ICD-10-CM

## 2013-04-09 MED ORDER — BENZONATATE 100 MG PO CAPS: ORAL_CAPSULE | ORAL | Status: AC

## 2013-04-09 MED ORDER — AMOXICILLIN 875 MG PO TABS: 875.0000 mg | ORAL_TABLET | Freq: Two times a day (BID) | ORAL | Status: DC

## 2013-04-09 MED ORDER — MUCINEX DM MAXIMUM STRENGTH 60-1200 MG PO TB12: 1.0000 | ORAL_TABLET | Freq: Two times a day (BID) | ORAL | Status: DC

## 2013-04-09 NOTE — Progress Notes (Signed)
   Subjective:    Patient ID: Laura Montgomery, female    DOB: Apr 19, 1975, 38 y.o.   MRN: [id number]  HPI Pt continues with the cough from her visit on 3/11 - sometimes in the am the cough is productive - mainly in the mornings and then dries throughout the day.  She is worried because it has been going on for so long.  She has facial pressure and PND.  The cough is keeping her up at night.  OTC meds - Mucinex, motrin   Review of Systems  HENT: Positive for congestion, postnasal drip and rhinorrhea (greenish/yellow). Negative for sore throat.   Respiratory: Positive for cough. Negative for shortness of breath and wheezing.   Gastrointestinal: Positive for nausea. Negative for vomiting and diarrhea.  Neurological: Positive for headaches.  Psychiatric/Behavioral: Positive for sleep disturbance.       Objective:   Physical Exam  Vitals reviewed. Constitutional: She is oriented to person, place, and time. She appears well-developed and well-nourished.  HENT:  Head: Normocephalic and atraumatic.  Right Ear: Hearing, tympanic membrane, external ear and ear canal normal.  Left Ear: Hearing, tympanic membrane, external ear and ear canal normal.  Nose: Nose normal.  Mouth/Throat: Uvula is midline, oropharynx is clear and moist and mucous membranes are normal.  Cardiovascular: Normal rate, regular rhythm and normal heart sounds.   No murmur heard. Pulmonary/Chest: Effort normal and breath sounds normal. She has no wheezes.  Neurological: She is alert and oriented to person, place, and time.  Skin: Skin is warm and dry.  Psychiatric: She has a normal mood and affect. Her behavior is normal. Judgment and thought content normal.       Assessment & Plan:  Sinus infection - Plan: amoxicillin (AMOXIL) 875 MG tablet, Dextromethorphan-Guaifenesin (MUCINEX DM MAXIMUM STRENGTH) 60-1200 MG TB12  Cough - Plan: benzonatate (TESSALON) 100 MG capsule, Dextromethorphan-Guaifenesin (MUCINEX DM MAXIMUM  STRENGTH) 60-1200 MG TB12  Push fluids and other symptomatic care d/w pt.  Windell Hummingbird PA-C  [facility] 04/09/2013 4:08 PM

## 2013-04-14 ENCOUNTER — Telehealth: Payer: Self-pay

## 2013-04-14 NOTE — Telephone Encounter (Signed)
Laura Montgomery, Pt states that she thinks that her job has mold coming out of the air vents and would like to discuss what she should do.  Best# [phone]

## 2013-04-14 NOTE — Telephone Encounter (Signed)
Spoke to pt, i advised her we can treat her symptoms, but other than that she should contact her management concerning this issue.

## 2013-04-14 NOTE — Telephone Encounter (Signed)
LMOM for pt to cb °

## 2013-05-04 ENCOUNTER — Telehealth: Payer: Self-pay

## 2013-05-04 DIAGNOSIS — J302 Other seasonal allergic rhinitis: Secondary | ICD-10-CM

## 2013-05-04 DIAGNOSIS — J329 Chronic sinusitis, unspecified: Secondary | ICD-10-CM

## 2013-05-04 NOTE — Telephone Encounter (Signed)
Pt has sinusitis annually would like a referral to an allergist for testing.

## 2013-05-04 NOTE — Telephone Encounter (Signed)
Would like to discuss either an allergy referral or ent referral

## 2013-05-05 NOTE — Telephone Encounter (Signed)
LMOM letting her know we placed referral

## 2013-05-05 NOTE — Telephone Encounter (Signed)
Done

## 2013-05-19 ENCOUNTER — Other Ambulatory Visit: Payer: Self-pay | Admitting: Allergy

## 2013-05-19 ENCOUNTER — Ambulatory Visit
Admission: RE | Admit: 2013-05-19 | Discharge: 2013-05-19 | Disposition: A | Discharge status: Home / Self Care | Payer: BC Managed Care – PPO | Source: Ambulatory Visit | Attending: Allergy | Admitting: Allergy

## 2013-05-19 DIAGNOSIS — J329 Chronic sinusitis, unspecified: Secondary | ICD-10-CM

## 2013-05-25 ENCOUNTER — Encounter: Payer: Self-pay | Admitting: Physician Assistant

## 2013-05-25 DIAGNOSIS — J309 Allergic rhinitis, unspecified: Secondary | ICD-10-CM | POA: Insufficient documentation

## 2013-07-08 ENCOUNTER — Ambulatory Visit (INDEPENDENT_AMBULATORY_CARE_PROVIDER_SITE_OTHER): Payer: BC Managed Care – PPO | Admitting: Family Medicine

## 2013-07-08 VITALS — BP 100/66 | HR 93 | Temp 98.8°F | Resp 16 | Ht 70.75 in | Wt 150.0 lb

## 2013-07-08 DIAGNOSIS — N760 Acute vaginitis: Secondary | ICD-10-CM

## 2013-07-08 LAB — POCT WET PREP WITH KOH
Clue Cells Wet Prep HPF POC: NEGATIVE
KOH Prep POC: NEGATIVE
Trichomonas, UA: NEGATIVE
Yeast Wet Prep HPF POC: NEGATIVE

## 2013-07-08 MED ORDER — METRONIDAZOLE 500 MG PO TABS: 500.0000 mg | ORAL_TABLET | Freq: Two times a day (BID) | ORAL | Status: DC

## 2013-07-08 NOTE — Patient Instructions (Signed)
Bacterial Vaginosis Bacterial vaginosis is a vaginal infection that occurs when the normal balance of bacteria in the vagina is disrupted. It results from an overgrowth of certain bacteria. This is the most common vaginal infection in women of childbearing age. Treatment is important to prevent complications, especially in pregnant women, as it can cause a premature delivery. CAUSES  Bacterial vaginosis is caused by an increase in harmful bacteria that are normally present in smaller amounts in the vagina. Several different kinds of bacteria can cause bacterial vaginosis. However, the reason that the condition develops is not fully understood. RISK FACTORS Certain activities or behaviors can put you at an increased risk of developing bacterial vaginosis, including:  Having a new sex partner or multiple sex partners.  Douching.  Using an intrauterine device (IUD) for contraception. Women do not get bacterial vaginosis from toilet seats, bedding, swimming pools, or contact with objects around them. SIGNS AND SYMPTOMS  Some women with bacterial vaginosis have no signs or symptoms. Common symptoms include:  Grey vaginal discharge.  A fishlike odor with discharge, especially after sexual intercourse.  Itching or burning of the vagina and vulva.  Burning or pain with urination. DIAGNOSIS  Your health care provider will take a medical history and examine the vagina for signs of bacterial vaginosis. A sample of vaginal fluid may be taken. Your health care provider will look at this sample under a microscope to check for bacteria and abnormal cells. A vaginal pH test may also be done.  TREATMENT  Bacterial vaginosis may be treated with antibiotic medicines. These may be given in the form of a pill or a vaginal cream. A second round of antibiotics may be prescribed if the condition comes back after treatment.  HOME CARE INSTRUCTIONS   Only take over-the-counter or prescription medicines as  directed by your health care provider.  If antibiotic medicine was prescribed, take it as directed. Make sure you finish it even if you start to feel better.  Do not have sex until treatment is completed.  Tell all sexual partners that you have a vaginal infection. They should see their health care provider and be treated if they have problems, such as a mild rash or itching.  Practice safe sex by using condoms and only having one sex partner. SEEK MEDICAL CARE IF:   Your symptoms are not improving after 3 days of treatment.  You have increased discharge or pain.  You have a fever. MAKE SURE YOU:   Understand these instructions.  Will watch your condition.  Will get help right away if you are not doing well or get worse. FOR MORE INFORMATION  Centers for Disease Control and Prevention, Division of STD Prevention: www.cdc.gov/std American Sexual Health Association (ASHA): www.ashastd.org  Document Released: 01/01/2005 Document Revised: 10/22/2012 Document Reviewed: 08/13/2012 ExitCare Patient Information 2015 ExitCare, LLC. This information is not intended to replace advice given to you by your health care provider. Make sure you discuss any questions you have with your health care provider.  

## 2013-07-08 NOTE — Progress Notes (Signed)
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 She's been on multiple antibiotics and most recently developed itching vaginally. She's given two courses of Diflucan with the vaginal symptoms continuing.  She now notes a fishy odor.  Last menstrual period: Started today  Patient denies any chance of STD.  Objective: No acute distress, patient articulate and in good mood. Vaginal exam reveals normal external vaginal genitalia, Mr. blood in vault, normal cervix. There is no cervical motion tenderness.  Results for orders placed in visit on 07/08/13  POCT WET PREP WITH KOH      Result Value Ref Range   Trichomonas, UA Negative     Clue Cells Wet Prep HPF POC neg     Epithelial Wet Prep HPF POC 4-5     Yeast Wet Prep HPF POC neg     Bacteria Wet Prep HPF POC trace     RBC Wet Prep HPF POC 20-30     WBC Wet Prep HPF POC 1-5     KOH Prep POC Negative     Assessment: It's difficult doing a wet prep who presents to starting it.. Patient has been on multiple antibiotics and Diflucan without improvement. This ureter suggests bacterial vaginitis.  Plan: Flagyl  Signed, Robyn Haber

## 2014-03-29 ENCOUNTER — Ambulatory Visit (INDEPENDENT_AMBULATORY_CARE_PROVIDER_SITE_OTHER): Payer: BLUE CROSS/BLUE SHIELD | Admitting: Physician Assistant

## 2014-03-29 VITALS — BP 100/80 | Temp 99.4°F | Resp 17 | Ht 69.5 in | Wt 158.2 lb

## 2014-03-29 DIAGNOSIS — R81 Glycosuria: Secondary | ICD-10-CM | POA: Diagnosis not present

## 2014-03-29 DIAGNOSIS — N3000 Acute cystitis without hematuria: Secondary | ICD-10-CM | POA: Diagnosis not present

## 2014-03-29 DIAGNOSIS — R350 Frequency of micturition: Secondary | ICD-10-CM

## 2014-03-29 LAB — POCT UA - MICROSCOPIC ONLY
Casts, Ur, LPF, POC: NEGATIVE
Crystals, Ur, HPF, POC: NEGATIVE
Mucus, UA: NEGATIVE
Yeast, UA: NEGATIVE

## 2014-03-29 LAB — POCT URINALYSIS DIPSTICK
Glucose, UA: 100
Nitrite, UA: POSITIVE
PH UA: 5
Protein, UA: 100
RBC UA: NEGATIVE
Spec Grav, UA: 1.01
Urobilinogen, UA: 4

## 2014-03-29 LAB — GLUCOSE, POCT (MANUAL RESULT ENTRY): POC Glucose: 78 mg/dl (ref 70–99)

## 2014-03-29 MED ORDER — CIPROFLOXACIN HCL 500 MG PO TABS: 500.0000 mg | ORAL_TABLET | Freq: Two times a day (BID) | ORAL | Status: DC

## 2014-03-29 NOTE — Patient Instructions (Signed)

## 2014-03-29 NOTE — Progress Notes (Signed)
   Subjective:    Patient ID: Laura Montgomery, female    DOB: 1975/08/19, 39 y.o.   MRN: [id number]  HPI Patient presents for 1 day of urinary frquency, urgency, and decreased volume. Denies fever, dysuria, abdominal/flank/back pain, hematuria, vaginal d/c, or N/V. Sexually active with man and they used condoms. LMP 03/23/14 was normal. Took an AZO last night that has helped.   Review of Systems  Constitutional: Negative for fever and chills.  Gastrointestinal: Negative for nausea and vomiting.  Genitourinary: Positive for urgency, frequency and decreased urine volume. Negative for dysuria, hematuria, flank pain, vaginal discharge, enuresis, difficulty urinating, genital sores, vaginal pain, menstrual problem, pelvic pain and dyspareunia.  Musculoskeletal: Negative for back pain.       Objective:   Physical Exam  Constitutional: She is oriented to person, place, and time. She appears well-developed and well-nourished. No distress.  Blood pressure 100/80, temperature 99.4 F (37.4 C), temperature source Oral, resp. rate 17, height 5' 9.5" (1.765 m), weight 158 lb 3.2 oz (71.759 kg), last menstrual period 03/23/2014.  HENT:  Head: Normocephalic and atraumatic.  Right Ear: External ear normal.  Left Ear: External ear normal.  Eyes: Conjunctivae are normal. Right eye exhibits no discharge. Left eye exhibits no discharge.  Cardiovascular: Normal rate, regular rhythm and normal heart sounds.  Exam reveals no gallop and no friction rub.   No murmur heard. Pulmonary/Chest: Effort normal and breath sounds normal. No respiratory distress. She has no wheezes. She has no rales.  Abdominal: Soft. Normal appearance and bowel sounds are normal. She exhibits no distension. There is no tenderness. There is no rebound, no guarding and no CVA tenderness. No hernia.  Neurological: She is alert and oriented to person, place, and time.  Skin: Skin is warm and dry. No rash noted. No erythema. No pallor.    Results for orders placed or performed in visit on 03/29/14  POCT UA - Microscopic Only  Result Value Ref Range   WBC, Ur, HPF, POC 8-12    RBC, urine, microscopic 0-1    Bacteria, U Microscopic trace    Mucus, UA neg    Epithelial cells, urine per micros 0-1    Crystals, Ur, HPF, POC neg    Casts, Ur, LPF, POC neg    Yeast, UA neg   POCT urinalysis dipstick  Result Value Ref Range   Color, UA orange    Clarity, UA clear    Glucose, UA 100    Bilirubin, UA small    Ketones, UA trace    Spec Grav, UA 1.010    Blood, UA neg    pH, UA 5.0    Protein, UA 100    Urobilinogen, UA 4.0    Nitrite, UA positive    Leukocytes, UA large (3+)   POCT glucose (manual entry)  Result Value Ref Range   POC Glucose 78 70 - 99 mg/dl      Assessment & Plan:  1. Acute cystitis without hematuria 2. Urinary frequency Increase water intake to 64 oz daily. Can continue AZO.  - POCT UA - Microscopic Only - POCT urinalysis dipstick - ciprofloxacin (CIPRO) 500 MG tablet; Take 1 tablet (500 mg total) by mouth 2 (two) times daily.  Dispense: 20 tablet; Refill: 0  3. Glucosuria False positive from dip as patient was using AZO. - POCT glucose (manual entry)   Alveta Heimlich PA-C  [facility] 03/29/2014 3:51 PM l

## 2014-04-05 ENCOUNTER — Telehealth: Payer: Self-pay

## 2014-04-05 DIAGNOSIS — B3731 Acute candidiasis of vulva and vagina: Secondary | ICD-10-CM

## 2014-04-05 DIAGNOSIS — B373 Candidiasis of vulva and vagina: Secondary | ICD-10-CM

## 2014-04-05 NOTE — Telephone Encounter (Signed)
No side effects from antibiotics except yeast infection.     Requesting [place] [facility] [employer]   [phone]

## 2014-04-05 NOTE — Telephone Encounter (Signed)
Can you write pt for diflucan?

## 2014-04-06 MED ORDER — FLUCONAZOLE 150 MG PO TABS: 150.0000 mg | ORAL_TABLET | Freq: Once | ORAL | Status: DC

## 2014-04-06 NOTE — Telephone Encounter (Signed)
No answer, voicemail not set up. 

## 2014-04-06 NOTE — Telephone Encounter (Signed)
RX sent

## 2014-04-07 NOTE — Telephone Encounter (Signed)
Spoke with pt, she already picked up the medication.

## 2014-11-08 ENCOUNTER — Ambulatory Visit (INDEPENDENT_AMBULATORY_CARE_PROVIDER_SITE_OTHER): Payer: BLUE CROSS/BLUE SHIELD | Admitting: Nurse Practitioner

## 2014-11-08 ENCOUNTER — Encounter: Payer: Self-pay | Admitting: Nurse Practitioner

## 2014-11-08 VITALS — BP 107/68 | HR 97 | Ht 70.0 in | Wt 164.0 lb

## 2014-11-08 DIAGNOSIS — S139XXD Sprain of joints and ligaments of unspecified parts of neck, subsequent encounter: Secondary | ICD-10-CM

## 2014-11-08 DIAGNOSIS — R519 Headache, unspecified: Secondary | ICD-10-CM

## 2014-11-08 DIAGNOSIS — R51 Headache: Secondary | ICD-10-CM | POA: Diagnosis not present

## 2014-11-08 MED ORDER — NORTRIPTYLINE HCL 10 MG PO CAPS: 10.0000 mg | ORAL_CAPSULE | Freq: Every day | ORAL | Status: DC

## 2014-11-08 NOTE — Progress Notes (Signed)
I have read the note, and I agree with the clinical assessment and plan.  Kaikoa Magro KEITH   
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
## 2014-11-08 NOTE — Patient Instructions (Signed)
Restart nortriptyline 10 mg at night will refill Given a list of neck exercises to perform during the day for her history of neck spasm Follow-up in 3 months Call if headaches worsen

## 2014-12-23 ENCOUNTER — Other Ambulatory Visit: Payer: Self-pay | Admitting: Obstetrics and Gynecology

## 2014-12-24 ENCOUNTER — Other Ambulatory Visit: Payer: Self-pay | Admitting: Obstetrics and Gynecology

## 2014-12-28 ENCOUNTER — Encounter (HOSPITAL_COMMUNITY): Payer: Self-pay | Admitting: *Deleted

## 2015-01-13 ENCOUNTER — Encounter (HOSPITAL_COMMUNITY): Payer: Self-pay | Admitting: Anesthesiology

## 2015-01-13 ENCOUNTER — Encounter (HOSPITAL_COMMUNITY)
Admission: RE | Disposition: A | Discharge status: Home / Self Care | Payer: Self-pay | Source: Ambulatory Visit | Attending: Obstetrics and Gynecology

## 2015-01-13 ENCOUNTER — Ambulatory Visit (HOSPITAL_COMMUNITY): Payer: BLUE CROSS/BLUE SHIELD | Admitting: Anesthesiology

## 2015-01-13 ENCOUNTER — Ambulatory Visit (HOSPITAL_COMMUNITY)
Admission: RE | Admit: 2015-01-13 | Discharge: 2015-01-13 | Disposition: A | Discharge status: Home / Self Care | Payer: BLUE CROSS/BLUE SHIELD | Source: Ambulatory Visit | Attending: Obstetrics and Gynecology | Admitting: Obstetrics and Gynecology

## 2015-01-13 DIAGNOSIS — N9489 Other specified conditions associated with female genital organs and menstrual cycle: Secondary | ICD-10-CM

## 2015-01-13 DIAGNOSIS — N840 Polyp of corpus uteri: Secondary | ICD-10-CM | POA: Insufficient documentation

## 2015-01-13 DIAGNOSIS — N938 Other specified abnormal uterine and vaginal bleeding: Secondary | ICD-10-CM | POA: Diagnosis present

## 2015-01-13 DIAGNOSIS — D250 Submucous leiomyoma of uterus: Secondary | ICD-10-CM | POA: Insufficient documentation

## 2015-01-13 HISTORY — PX: DILATATION & CURETTAGE/HYSTEROSCOPY WITH MYOSURE: SHX6511

## 2015-01-13 SURGERY — DILATATION & CURETTAGE/HYSTEROSCOPY WITH MYOSURE
Anesthesia: General | Site: Vagina

## 2015-01-13 MED ORDER — LABETALOL HCL 5 MG/ML IV SOLN
INTRAVENOUS | Status: AC
  Filled 2015-01-13: qty 4

## 2015-01-13 MED ORDER — SCOPOLAMINE 1 MG/3DAYS TD PT72
1.0000 | MEDICATED_PATCH | Freq: Once | TRANSDERMAL | Status: DC
  Administered 2015-01-13: 1.5 mg via TRANSDERMAL

## 2015-01-13 MED ORDER — FENTANYL CITRATE (PF) 100 MCG/2ML IJ SOLN
INTRAMUSCULAR | Status: AC
  Filled 2015-01-13: qty 2

## 2015-01-13 MED ORDER — IBUPROFEN 800 MG PO TABS: 800.0000 mg | ORAL_TABLET | Freq: Three times a day (TID) | ORAL | Status: DC | PRN

## 2015-01-13 MED ORDER — ONDANSETRON HCL 4 MG/2ML IJ SOLN
INTRAMUSCULAR | Status: AC
  Filled 2015-01-13: qty 2

## 2015-01-13 MED ORDER — SCOPOLAMINE 1 MG/3DAYS TD PT72
MEDICATED_PATCH | TRANSDERMAL | Status: AC
  Administered 2015-01-13: 1.5 mg via TRANSDERMAL
  Filled 2015-01-13: qty 1

## 2015-01-13 MED ORDER — KETOROLAC TROMETHAMINE 30 MG/ML IJ SOLN
INTRAMUSCULAR | Status: AC
  Filled 2015-01-13: qty 1

## 2015-01-13 MED ORDER — DEXAMETHASONE SODIUM PHOSPHATE 10 MG/ML IJ SOLN
INTRAMUSCULAR | Status: DC | PRN
  Administered 2015-01-13: 4 mg via INTRAVENOUS

## 2015-01-13 MED ORDER — KETOROLAC TROMETHAMINE 30 MG/ML IJ SOLN
INTRAMUSCULAR | Status: DC | PRN
  Administered 2015-01-13 (×2): 30 mg via INTRAVENOUS

## 2015-01-13 MED ORDER — PROPOFOL 10 MG/ML IV BOLUS
INTRAVENOUS | Status: AC
  Filled 2015-01-13: qty 20

## 2015-01-13 MED ORDER — MIDAZOLAM HCL 2 MG/2ML IJ SOLN
INTRAMUSCULAR | Status: AC
  Filled 2015-01-13: qty 2

## 2015-01-13 MED ORDER — PROPOFOL 10 MG/ML IV BOLUS
INTRAVENOUS | Status: DC | PRN
  Administered 2015-01-13: 200 mg via INTRAVENOUS

## 2015-01-13 MED ORDER — FENTANYL CITRATE (PF) 100 MCG/2ML IJ SOLN
INTRAMUSCULAR | Status: DC | PRN
  Administered 2015-01-13: 50 ug via INTRAVENOUS

## 2015-01-13 MED ORDER — LIDOCAINE HCL (CARDIAC) 20 MG/ML IV SOLN
INTRAVENOUS | Status: AC
  Filled 2015-01-13: qty 5

## 2015-01-13 MED ORDER — LABETALOL HCL 5 MG/ML IV SOLN
INTRAVENOUS | Status: DC | PRN
  Administered 2015-01-13: 10 mg via INTRAVENOUS

## 2015-01-13 MED ORDER — ONDANSETRON HCL 4 MG/2ML IJ SOLN
INTRAMUSCULAR | Status: DC | PRN
  Administered 2015-01-13: 4 mg via INTRAVENOUS

## 2015-01-13 MED ORDER — LIDOCAINE HCL (CARDIAC) 20 MG/ML IV SOLN
INTRAVENOUS | Status: DC | PRN
  Administered 2015-01-13: 30 mg via INTRAVENOUS
  Administered 2015-01-13: 70 mg via INTRAVENOUS

## 2015-01-13 MED ORDER — LACTATED RINGERS IV SOLN
INTRAVENOUS | Status: DC
  Administered 2015-01-13 (×2): via INTRAVENOUS

## 2015-01-13 MED ORDER — SODIUM CHLORIDE 0.9 % IR SOLN
Status: DC | PRN
  Administered 2015-01-13 (×2): 3000 mL

## 2015-01-13 MED ORDER — DEXAMETHASONE SODIUM PHOSPHATE 4 MG/ML IJ SOLN
INTRAMUSCULAR | Status: AC
  Filled 2015-01-13: qty 1

## 2015-01-13 MED ORDER — MIDAZOLAM HCL 2 MG/2ML IJ SOLN
INTRAMUSCULAR | Status: DC | PRN
  Administered 2015-01-13: 1 mg via INTRAVENOUS

## 2015-01-13 SURGICAL SUPPLY — 20 items
CANISTER SUCT 3000ML (MISCELLANEOUS) ×3 IMPLANT
CATH ROBINSON RED A/P 16FR (CATHETERS) ×3 IMPLANT
CLOTH BEACON ORANGE TIMEOUT ST (SAFETY) ×3 IMPLANT
CONTAINER PREFILL 10% NBF 60ML (FORM) ×6 IMPLANT
DEVICE MYOSURE LITE (MISCELLANEOUS) ×3 IMPLANT
DEVICE MYOSURE REACH (MISCELLANEOUS) IMPLANT
ELECT REM PT RETURN 9FT ADLT (ELECTROSURGICAL) ×3
ELECTRODE REM PT RTRN 9FT ADLT (ELECTROSURGICAL) ×1 IMPLANT
FILTER ARTHROSCOPY CONVERTOR (FILTER) ×3 IMPLANT
GLOVE BIOGEL PI IND STRL 7.0 (GLOVE) ×3 IMPLANT
GLOVE BIOGEL PI INDICATOR 7.0 (GLOVE) ×6
GLOVE ECLIPSE 6.5 STRL STRAW (GLOVE) ×3 IMPLANT
GOWN STRL REUS W/TWL LRG LVL3 (GOWN DISPOSABLE) ×6 IMPLANT
PACK VAGINAL MINOR WOMEN LF (CUSTOM PROCEDURE TRAY) ×3 IMPLANT
PAD OB MATERNITY 4.3X12.25 (PERSONAL CARE ITEMS) ×3 IMPLANT
SEAL ROD LENS SCOPE MYOSURE (ABLATOR) ×3 IMPLANT
TOWEL OR 17X24 6PK STRL BLUE (TOWEL DISPOSABLE) ×6 IMPLANT
TUBING AQUILEX INFLOW (TUBING) ×3 IMPLANT
TUBING AQUILEX OUTFLOW (TUBING) ×3 IMPLANT
WATER STERILE IRR 1000ML POUR (IV SOLUTION) ×3 IMPLANT

## 2015-01-13 NOTE — Transfer of Care (Signed)
Immediate Anesthesia Transfer of Care Note  Patient: Laura Montgomery  Procedure(s) Performed: Procedure(s): DILATATION & CURETTAGE/HYSTEROSCOPY WITH MYOSURE (N/A)  Patient Location: PACU  Anesthesia Type:General  Level of Consciousness: awake, alert , oriented and patient cooperative  Airway & Oxygen Therapy: Patient Spontanous Breathing and Patient connected to nasal cannula oxygen  Post-op Assessment: Report given to RN and Post -op Vital signs reviewed and stable  Post vital signs: Reviewed and stable  Last Vitals:  Filed Vitals:   01/13/15 0823 01/13/15 0826  BP: 126/89   Pulse:  125  Temp: 123XX123 C     Complications: No apparent anesthesia complications

## 2015-01-13 NOTE — Anesthesia Preprocedure Evaluation (Addendum)
Anesthesia Evaluation  Patient identified by MRN, date of birth, ID band Patient awake    Reviewed: Allergy & Precautions, NPO status , Patient's Chart, lab work & pertinent test results  Airway Mallampati: II  TM Distance: >3 FB Neck ROM: Full    Dental  (+) Teeth Intact, Dental Advisory Given   Pulmonary neg pulmonary ROS,    Pulmonary exam normal breath sounds clear to auscultation       Cardiovascular Exercise Tolerance: Good (-) hypertension(-) angina(-) CAD and (-) Past MI negative cardio ROS   Rhythm:Regular Rate:Tachycardia     Neuro/Psych  Headaches, negative psych ROS   GI/Hepatic negative GI ROS, Neg liver ROS,   Endo/Other  negative endocrine ROS  Renal/GU negative Renal ROS     Musculoskeletal negative musculoskeletal ROS (+)   Abdominal   Peds  Hematology negative hematology ROS (+)   Anesthesia Other Findings Day of surgery medications reviewed with the patient.  Mennorhagia, endometrial mass  Reproductive/Obstetrics negative OB ROS                           Anesthesia Physical Anesthesia Plan  ASA: II  Anesthesia Plan: General   Post-op Pain Management:    Induction: Intravenous  Airway Management Planned: LMA  Additional Equipment:   Intra-op Plan:   Post-operative Plan: Extubation in OR  Informed Consent: I have reviewed the patients History and Physical, chart, labs and discussed the procedure including the risks, benefits and alternatives for the proposed anesthesia with the patient or authorized representative who has indicated his/her understanding and acceptance.   Dental advisory given  Plan Discussed with: CRNA  Anesthesia Plan Comments: (Risks/benefits of general anesthesia discussed with patient including risk of damage to teeth, lips, gum, and tongue, nausea/vomiting, allergic reactions to medications, and the possibility of heart attack, stroke  and death.  All patient questions answered.  Patient wishes to proceed.)        Anesthesia Quick Evaluation

## 2015-01-13 NOTE — Anesthesia Postprocedure Evaluation (Signed)
Anesthesia Post Note  Patient: Laura Montgomery  Procedure(s) Performed: Procedure(s) (LRB): DILATATION & CURETTAGE/HYSTEROSCOPY WITH MYOSURE (N/A)  Patient location during evaluation: PACU Anesthesia Type: General Level of consciousness: awake and alert Pain management: pain level controlled Vital Signs Assessment: post-procedure vital signs reviewed and stable Respiratory status: spontaneous breathing, nonlabored ventilation, respiratory function stable and patient connected to nasal cannula oxygen Cardiovascular status: blood pressure returned to baseline and stable Postop Assessment: no signs of nausea or vomiting Anesthetic complications: no    Last Vitals:  Filed Vitals:   01/13/15 1130 01/13/15 1145  BP:  130/89  Pulse: 86 83  Temp:  36.5 C  Resp: 16 16    Last Pain: There were no vitals filed for this visit.               Catalina Gravel

## 2015-01-13 NOTE — Brief Op Note (Signed)
01/13/2015  10:39 AM  PATIENT:  Loel Ro Konicki  39 y.o. female  PRE-OPERATIVE DIAGNOSIS:  menorrhagia, endometrial mass  POST-OPERATIVE DIAGNOSIS:  menorrhagia, endometrial polyp, SM fibroid  PROCEDURE:  Diagnostic hysteroscopy, hysteroscopic resection of endometrial polyps, SM fibroid resection, dilation and curettage  SURGEON:  Surgeon(s) and Role:    * Servando Salina, MD - Primary  PHYSICIAN ASSISTANT:   ASSISTANTS: none   ANESTHESIA:   general Findings: tubal ostia seen., small SM fibroid, endometrial polyp EBL:  Total I/O In: 1300 [I.V.:1300] Out: 100 [Urine:100]  BLOOD ADMINISTERED:none  DRAINS: none   LOCAL MEDICATIONS USED:  NONE  SPECIMEN:  Source of Specimen:  emc with polyp, fibroid  DISPOSITION OF SPECIMEN:  PATHOLOGY  COUNTS:  YES  TOURNIQUET:  * No tourniquets in log *  DICTATION: .Other Dictation: Dictation Number [id number]  PLAN OF CARE: Discharge to home after PACU  PATIENT DISPOSITION:  PACU - hemodynamically stable.   Delay start of Pharmacological VTE agent (>24hrs) due to surgical blood loss or risk of bleeding: no

## 2015-01-13 NOTE — Discharge Instructions (Signed)
CALL  IF TEMP>100.4, NOTHING PER VAGINA X 1 WK, CALL IF SOAKING A MAXI  PAD EVERY HOUR OR MORE FREQUENTLY  DISCHARGE INSTRUCTIONS: HYSTEROSCOPY  The following instructions have been prepared to help you care for yourself upon your return home.  May Remove Scop patch on or before 01/15/15  May take Ibuprofen after 4:15pm today.   Personal hygiene:  Use sanitary pads for vaginal drainage, not tampons.  Shower the day after your procedure.  NO tub baths, pools or Jacuzzis for 2-3 weeks.  Wipe front to back after using the bathroom.  Activity and limitations:  Do NOT drive or operate any equipment for 24 hours. The effects of anesthesia are still present and drowsiness may result.  Do NOT rest in bed all day.  Walking is encouraged.  Walk up and down stairs slowly.  You may resume your normal activity in one to two days or as indicated by your physician. Sexual activity: NO intercourse for at least 2 weeks after the procedure, or as indicated by your Doctor.  Diet: Eat a light meal as desired this evening. You may resume your usual diet tomorrow.  Return to Work: You may resume your work activities in one to two days or as indicated by Marine scientist.  What to expect after your surgery: Expect to have vaginal bleeding/discharge for 2-3 days and spotting for up to 10 days. It is not unusual to have soreness for up to 1-2 weeks. You may have a slight burning sensation when you urinate for the first day. Mild cramps may continue for a couple of days. You may have a regular period in 2-6 weeks.  Call your doctor for any of the following:  Excessive vaginal bleeding or clotting, saturating and changing one pad every hour.  Inability to urinate 6 hours after discharge from hospital.  Pain not relieved by pain medication.  Fever of 100.4 F or greater.  Unusual vaginal discharge or odor.  Return to office _________________Call for an appointment  ___________________ Patients signature: ______________________ Nurses signature ________________________  [facility] [phone]

## 2015-01-13 NOTE — Anesthesia Procedure Notes (Signed)
Procedure Name: LMA Insertion Date/Time: 01/13/2015 9:55 AM Performed by: Tobin Chad Pre-anesthesia Checklist: Patient identified, Timeout performed, Emergency Drugs available, Suction available and Patient being monitored Patient Re-evaluated:Patient Re-evaluated prior to inductionOxygen Delivery Method: Circle system utilized and Simple face mask Preoxygenation: Pre-oxygenation with 100% oxygen Intubation Type: IV induction and Inhalational induction Ventilation: Mask ventilation without difficulty LMA: LMA inserted LMA Size: 4.0 Grade View: Grade II Number of attempts: 1 Placement Confirmation: positive ETCO2 and breath sounds checked- equal and bilateral Dental Injury: Teeth and Oropharynx as per pre-operative assessment

## 2015-01-13 NOTE — Op Note (Signed)
NAME:  Laura Montgomery, Laura Montgomery NO.:  [id number]  MEDICAL RECORD NO.:  [id number]  LOCATION:  [facility]                          FACILITY:  [facility]  PHYSICIAN:  Servando Salina, M.D.DATE OF BIRTH:  [birth year]  DATE OF PROCEDURE:  01/13/2015 DATE OF DISCHARGE:  01/13/2015                              OPERATIVE REPORT   PREOPERATIVE DIAGNOSIS:  Dysfunctional uterine bleeding, endometrial mass.  PROCEDURE:  Diagnostic hysteroscopy, hysteroscopic resection of submucosal fibroid, endometrial polyps using MyoSure, dilation and curettage.  POSTOPERATIVE DIAGNOSES:  Dysfunctional uterine bleeding, endometrial polyp, submucosal fibroid.  ANESTHESIA:  General.  SURGEON:  Servando Salina, MD  ASSISTANT:  None.  PROCEDURE IN DETAIL:  Under adequate general anesthesia, the patient was placed in the dorsal lithotomy position.  She was sterilely prepped and draped in the usual fashion.  Bladder was catheterized to moderate amount of urine.  Examination under anesthesia revealed a retroverted uterus.  No adnexal masses could be appreciated.  Bivalve speculum was placed in the vagina.  Single-tooth tenaculum was placed on the anterior lip of the cervix.  The cervix was then serially dilated up to #[facility] dilator.  The MyoSure hysteroscope was introduced into the uterine cavity without incident.  At that point, there was a small submucosal fibroid noted, somewhat fundal and anterior along with a left endometrial polyp and some other smaller polyps noted on the right side. The classic/light operative MyoSure instrument was then inserted.  All the polyps removed as well as the submucosal fibroid.  Once this was done, the endocervical canal was inspected.  No lesions noted.  The apparatus was removed.  At that point, the uterus was then gently curetted for small amount of tissue.  Procedure was then terminated by removing all instruments from the vagina.  SPECIMEN LABELED:   Endometrial curetting with polyps and submucosal fibroid resections were sent to Pathology.  ESTIMATED BLOOD LOSS:  Probably 10 mL.  COMPLICATION:  None.  The patient tolerated procedure well, was transferred to recovery room in stable condition.     Servando Salina, M.D.     [place]/MEDQ  D:  01/13/2015  T:  01/13/2015  Job:  [id number]

## 2015-01-14 ENCOUNTER — Encounter (HOSPITAL_COMMUNITY): Payer: Self-pay | Admitting: Obstetrics and Gynecology

## 2015-02-09 ENCOUNTER — Ambulatory Visit (INDEPENDENT_AMBULATORY_CARE_PROVIDER_SITE_OTHER): Payer: BLUE CROSS/BLUE SHIELD | Admitting: Nurse Practitioner

## 2015-02-09 ENCOUNTER — Encounter: Payer: Self-pay | Admitting: Nurse Practitioner

## 2015-02-09 VITALS — BP 129/87 | HR 114 | Ht 70.0 in | Wt 161.4 lb

## 2015-02-09 DIAGNOSIS — S139XXD Sprain of joints and ligaments of unspecified parts of neck, subsequent encounter: Secondary | ICD-10-CM | POA: Diagnosis not present

## 2015-02-09 DIAGNOSIS — R519 Headache, unspecified: Secondary | ICD-10-CM

## 2015-02-09 DIAGNOSIS — R51 Headache: Secondary | ICD-10-CM

## 2015-02-09 MED ORDER — NORTRIPTYLINE HCL 10 MG PO CAPS: 10.0000 mg | ORAL_CAPSULE | Freq: Every day | ORAL | Status: DC
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
## 2015-02-09 NOTE — Patient Instructions (Signed)
Continue nortriptyline 10 mg at night will refill Continue  neck exercises to perform during the day for her history of neck spasm Follow-up in 6 months Call if headaches worsen

## 2015-02-09 NOTE — Progress Notes (Signed)
I have read the note, and I agree with the clinical assessment and plan.  Malani Lees KEITH   

## 2015-04-22 DIAGNOSIS — J3089 Other allergic rhinitis: Secondary | ICD-10-CM | POA: Diagnosis not present

## 2015-04-22 DIAGNOSIS — J301 Allergic rhinitis due to pollen: Secondary | ICD-10-CM | POA: Diagnosis not present

## 2015-04-27 DIAGNOSIS — J301 Allergic rhinitis due to pollen: Secondary | ICD-10-CM | POA: Diagnosis not present

## 2015-04-27 DIAGNOSIS — J3089 Other allergic rhinitis: Secondary | ICD-10-CM | POA: Diagnosis not present

## 2015-05-04 DIAGNOSIS — J301 Allergic rhinitis due to pollen: Secondary | ICD-10-CM | POA: Diagnosis not present

## 2015-05-04 DIAGNOSIS — J3089 Other allergic rhinitis: Secondary | ICD-10-CM | POA: Diagnosis not present

## 2015-05-11 DIAGNOSIS — J301 Allergic rhinitis due to pollen: Secondary | ICD-10-CM | POA: Diagnosis not present

## 2015-05-11 DIAGNOSIS — J3089 Other allergic rhinitis: Secondary | ICD-10-CM | POA: Diagnosis not present

## 2015-05-18 DIAGNOSIS — J3089 Other allergic rhinitis: Secondary | ICD-10-CM | POA: Diagnosis not present

## 2015-05-18 DIAGNOSIS — J301 Allergic rhinitis due to pollen: Secondary | ICD-10-CM | POA: Diagnosis not present

## 2015-05-25 DIAGNOSIS — J301 Allergic rhinitis due to pollen: Secondary | ICD-10-CM | POA: Diagnosis not present

## 2015-05-25 DIAGNOSIS — J3089 Other allergic rhinitis: Secondary | ICD-10-CM | POA: Diagnosis not present

## 2015-06-09 DIAGNOSIS — J301 Allergic rhinitis due to pollen: Secondary | ICD-10-CM | POA: Diagnosis not present

## 2015-06-09 DIAGNOSIS — J3089 Other allergic rhinitis: Secondary | ICD-10-CM | POA: Diagnosis not present

## 2015-06-10 DIAGNOSIS — Z113 Encounter for screening for infections with a predominantly sexual mode of transmission: Secondary | ICD-10-CM | POA: Diagnosis not present

## 2015-06-10 DIAGNOSIS — E559 Vitamin D deficiency, unspecified: Secondary | ICD-10-CM | POA: Diagnosis not present

## 2015-06-15 DIAGNOSIS — J301 Allergic rhinitis due to pollen: Secondary | ICD-10-CM | POA: Diagnosis not present

## 2015-06-15 DIAGNOSIS — J3089 Other allergic rhinitis: Secondary | ICD-10-CM | POA: Diagnosis not present

## 2015-06-22 DIAGNOSIS — J3089 Other allergic rhinitis: Secondary | ICD-10-CM | POA: Diagnosis not present

## 2015-06-22 DIAGNOSIS — J301 Allergic rhinitis due to pollen: Secondary | ICD-10-CM | POA: Diagnosis not present

## 2015-06-29 DIAGNOSIS — J3089 Other allergic rhinitis: Secondary | ICD-10-CM | POA: Diagnosis not present

## 2015-06-29 DIAGNOSIS — J301 Allergic rhinitis due to pollen: Secondary | ICD-10-CM | POA: Diagnosis not present

## 2015-07-06 DIAGNOSIS — J3089 Other allergic rhinitis: Secondary | ICD-10-CM | POA: Diagnosis not present

## 2015-07-06 DIAGNOSIS — J301 Allergic rhinitis due to pollen: Secondary | ICD-10-CM | POA: Diagnosis not present

## 2015-07-13 DIAGNOSIS — J301 Allergic rhinitis due to pollen: Secondary | ICD-10-CM | POA: Diagnosis not present

## 2015-07-13 DIAGNOSIS — J3089 Other allergic rhinitis: Secondary | ICD-10-CM | POA: Diagnosis not present

## 2015-07-21 DIAGNOSIS — J3089 Other allergic rhinitis: Secondary | ICD-10-CM | POA: Diagnosis not present

## 2015-07-21 DIAGNOSIS — J301 Allergic rhinitis due to pollen: Secondary | ICD-10-CM | POA: Diagnosis not present

## 2015-08-03 DIAGNOSIS — J3089 Other allergic rhinitis: Secondary | ICD-10-CM | POA: Diagnosis not present

## 2015-08-03 DIAGNOSIS — J301 Allergic rhinitis due to pollen: Secondary | ICD-10-CM | POA: Diagnosis not present

## 2015-08-05 DIAGNOSIS — J301 Allergic rhinitis due to pollen: Secondary | ICD-10-CM | POA: Diagnosis not present

## 2015-08-09 ENCOUNTER — Ambulatory Visit: Payer: BLUE CROSS/BLUE SHIELD | Admitting: Nurse Practitioner

## 2015-08-17 DIAGNOSIS — J301 Allergic rhinitis due to pollen: Secondary | ICD-10-CM | POA: Diagnosis not present

## 2015-08-17 DIAGNOSIS — J3089 Other allergic rhinitis: Secondary | ICD-10-CM | POA: Diagnosis not present

## 2015-08-24 DIAGNOSIS — J301 Allergic rhinitis due to pollen: Secondary | ICD-10-CM | POA: Diagnosis not present

## 2015-08-24 DIAGNOSIS — J3089 Other allergic rhinitis: Secondary | ICD-10-CM | POA: Diagnosis not present

## 2015-08-31 DIAGNOSIS — J3089 Other allergic rhinitis: Secondary | ICD-10-CM | POA: Diagnosis not present

## 2015-08-31 DIAGNOSIS — J301 Allergic rhinitis due to pollen: Secondary | ICD-10-CM | POA: Diagnosis not present

## 2015-09-07 DIAGNOSIS — J301 Allergic rhinitis due to pollen: Secondary | ICD-10-CM | POA: Diagnosis not present

## 2015-09-07 DIAGNOSIS — J3089 Other allergic rhinitis: Secondary | ICD-10-CM | POA: Diagnosis not present

## 2015-09-22 DIAGNOSIS — J301 Allergic rhinitis due to pollen: Secondary | ICD-10-CM | POA: Diagnosis not present

## 2015-09-22 DIAGNOSIS — J3089 Other allergic rhinitis: Secondary | ICD-10-CM | POA: Diagnosis not present

## 2015-09-26 DIAGNOSIS — J3089 Other allergic rhinitis: Secondary | ICD-10-CM | POA: Diagnosis not present

## 2015-10-05 DIAGNOSIS — J3089 Other allergic rhinitis: Secondary | ICD-10-CM | POA: Diagnosis not present

## 2015-10-05 DIAGNOSIS — J301 Allergic rhinitis due to pollen: Secondary | ICD-10-CM | POA: Diagnosis not present

## 2015-10-12 DIAGNOSIS — J301 Allergic rhinitis due to pollen: Secondary | ICD-10-CM | POA: Diagnosis not present

## 2015-10-12 DIAGNOSIS — J3089 Other allergic rhinitis: Secondary | ICD-10-CM | POA: Diagnosis not present

## 2015-10-26 DIAGNOSIS — J3089 Other allergic rhinitis: Secondary | ICD-10-CM | POA: Diagnosis not present

## 2015-10-26 DIAGNOSIS — J301 Allergic rhinitis due to pollen: Secondary | ICD-10-CM | POA: Diagnosis not present

## 2015-11-02 DIAGNOSIS — J3089 Other allergic rhinitis: Secondary | ICD-10-CM | POA: Diagnosis not present

## 2015-11-02 DIAGNOSIS — J301 Allergic rhinitis due to pollen: Secondary | ICD-10-CM | POA: Diagnosis not present

## 2015-11-09 DIAGNOSIS — J3089 Other allergic rhinitis: Secondary | ICD-10-CM | POA: Diagnosis not present

## 2015-11-09 DIAGNOSIS — J301 Allergic rhinitis due to pollen: Secondary | ICD-10-CM | POA: Diagnosis not present

## 2015-11-16 DIAGNOSIS — J3089 Other allergic rhinitis: Secondary | ICD-10-CM | POA: Diagnosis not present

## 2015-11-16 DIAGNOSIS — J301 Allergic rhinitis due to pollen: Secondary | ICD-10-CM | POA: Diagnosis not present

## 2015-11-30 DIAGNOSIS — J301 Allergic rhinitis due to pollen: Secondary | ICD-10-CM | POA: Diagnosis not present

## 2015-11-30 DIAGNOSIS — J3089 Other allergic rhinitis: Secondary | ICD-10-CM | POA: Diagnosis not present

## 2015-12-05 DIAGNOSIS — H1045 Other chronic allergic conjunctivitis: Secondary | ICD-10-CM | POA: Diagnosis not present

## 2015-12-05 DIAGNOSIS — J301 Allergic rhinitis due to pollen: Secondary | ICD-10-CM | POA: Diagnosis not present

## 2015-12-05 DIAGNOSIS — J3089 Other allergic rhinitis: Secondary | ICD-10-CM | POA: Diagnosis not present

## 2015-12-14 DIAGNOSIS — J301 Allergic rhinitis due to pollen: Secondary | ICD-10-CM | POA: Diagnosis not present

## 2015-12-14 DIAGNOSIS — J3089 Other allergic rhinitis: Secondary | ICD-10-CM | POA: Diagnosis not present

## 2015-12-21 DIAGNOSIS — J3089 Other allergic rhinitis: Secondary | ICD-10-CM | POA: Diagnosis not present

## 2015-12-21 DIAGNOSIS — J301 Allergic rhinitis due to pollen: Secondary | ICD-10-CM | POA: Diagnosis not present

## 2016-01-11 DIAGNOSIS — J301 Allergic rhinitis due to pollen: Secondary | ICD-10-CM | POA: Diagnosis not present

## 2016-01-11 DIAGNOSIS — J3089 Other allergic rhinitis: Secondary | ICD-10-CM | POA: Diagnosis not present

## 2016-01-18 DIAGNOSIS — J301 Allergic rhinitis due to pollen: Secondary | ICD-10-CM | POA: Diagnosis not present

## 2016-01-18 DIAGNOSIS — J3089 Other allergic rhinitis: Secondary | ICD-10-CM | POA: Diagnosis not present

## 2016-01-25 DIAGNOSIS — J301 Allergic rhinitis due to pollen: Secondary | ICD-10-CM | POA: Diagnosis not present

## 2016-01-25 DIAGNOSIS — J3089 Other allergic rhinitis: Secondary | ICD-10-CM | POA: Diagnosis not present

## 2016-02-08 DIAGNOSIS — J3089 Other allergic rhinitis: Secondary | ICD-10-CM | POA: Diagnosis not present

## 2016-02-08 DIAGNOSIS — J301 Allergic rhinitis due to pollen: Secondary | ICD-10-CM | POA: Diagnosis not present

## 2016-02-15 DIAGNOSIS — J301 Allergic rhinitis due to pollen: Secondary | ICD-10-CM | POA: Diagnosis not present

## 2016-02-15 DIAGNOSIS — J3089 Other allergic rhinitis: Secondary | ICD-10-CM | POA: Diagnosis not present

## 2016-02-21 DIAGNOSIS — L282 Other prurigo: Secondary | ICD-10-CM | POA: Diagnosis not present

## 2016-02-21 DIAGNOSIS — L309 Dermatitis, unspecified: Secondary | ICD-10-CM | POA: Diagnosis not present

## 2016-02-22 DIAGNOSIS — J3089 Other allergic rhinitis: Secondary | ICD-10-CM | POA: Diagnosis not present

## 2016-02-22 DIAGNOSIS — J301 Allergic rhinitis due to pollen: Secondary | ICD-10-CM | POA: Diagnosis not present

## 2016-02-29 DIAGNOSIS — J3089 Other allergic rhinitis: Secondary | ICD-10-CM | POA: Diagnosis not present

## 2016-02-29 DIAGNOSIS — J301 Allergic rhinitis due to pollen: Secondary | ICD-10-CM | POA: Diagnosis not present

## 2016-03-01 DIAGNOSIS — J301 Allergic rhinitis due to pollen: Secondary | ICD-10-CM | POA: Diagnosis not present

## 2016-03-14 DIAGNOSIS — J301 Allergic rhinitis due to pollen: Secondary | ICD-10-CM | POA: Diagnosis not present

## 2016-03-14 DIAGNOSIS — J3089 Other allergic rhinitis: Secondary | ICD-10-CM | POA: Diagnosis not present

## 2016-03-21 DIAGNOSIS — J3089 Other allergic rhinitis: Secondary | ICD-10-CM | POA: Diagnosis not present

## 2016-03-21 DIAGNOSIS — J301 Allergic rhinitis due to pollen: Secondary | ICD-10-CM | POA: Diagnosis not present

## 2016-03-23 DIAGNOSIS — J3089 Other allergic rhinitis: Secondary | ICD-10-CM | POA: Diagnosis not present

## 2016-03-28 DIAGNOSIS — J301 Allergic rhinitis due to pollen: Secondary | ICD-10-CM | POA: Diagnosis not present

## 2016-03-28 DIAGNOSIS — J3089 Other allergic rhinitis: Secondary | ICD-10-CM | POA: Diagnosis not present

## 2016-04-11 DIAGNOSIS — J3089 Other allergic rhinitis: Secondary | ICD-10-CM | POA: Diagnosis not present

## 2016-04-11 DIAGNOSIS — J301 Allergic rhinitis due to pollen: Secondary | ICD-10-CM | POA: Diagnosis not present

## 2016-04-19 DIAGNOSIS — J3089 Other allergic rhinitis: Secondary | ICD-10-CM | POA: Diagnosis not present

## 2016-04-19 DIAGNOSIS — J301 Allergic rhinitis due to pollen: Secondary | ICD-10-CM | POA: Diagnosis not present

## 2016-04-25 DIAGNOSIS — J3089 Other allergic rhinitis: Secondary | ICD-10-CM | POA: Diagnosis not present

## 2016-04-25 DIAGNOSIS — J301 Allergic rhinitis due to pollen: Secondary | ICD-10-CM | POA: Diagnosis not present

## 2016-05-02 DIAGNOSIS — J301 Allergic rhinitis due to pollen: Secondary | ICD-10-CM | POA: Diagnosis not present

## 2016-05-02 DIAGNOSIS — J3089 Other allergic rhinitis: Secondary | ICD-10-CM | POA: Diagnosis not present

## 2016-05-09 DIAGNOSIS — J3089 Other allergic rhinitis: Secondary | ICD-10-CM | POA: Diagnosis not present

## 2016-05-09 DIAGNOSIS — J301 Allergic rhinitis due to pollen: Secondary | ICD-10-CM | POA: Diagnosis not present

## 2016-05-16 DIAGNOSIS — J301 Allergic rhinitis due to pollen: Secondary | ICD-10-CM | POA: Diagnosis not present

## 2016-05-16 DIAGNOSIS — J3089 Other allergic rhinitis: Secondary | ICD-10-CM | POA: Diagnosis not present

## 2016-05-23 DIAGNOSIS — J301 Allergic rhinitis due to pollen: Secondary | ICD-10-CM | POA: Diagnosis not present

## 2016-05-23 DIAGNOSIS — J3089 Other allergic rhinitis: Secondary | ICD-10-CM | POA: Diagnosis not present

## 2016-05-30 DIAGNOSIS — J3089 Other allergic rhinitis: Secondary | ICD-10-CM | POA: Diagnosis not present

## 2016-05-30 DIAGNOSIS — J301 Allergic rhinitis due to pollen: Secondary | ICD-10-CM | POA: Diagnosis not present

## 2016-06-06 DIAGNOSIS — J3089 Other allergic rhinitis: Secondary | ICD-10-CM | POA: Diagnosis not present

## 2016-06-06 DIAGNOSIS — J301 Allergic rhinitis due to pollen: Secondary | ICD-10-CM | POA: Diagnosis not present

## 2016-06-12 DIAGNOSIS — J3089 Other allergic rhinitis: Secondary | ICD-10-CM | POA: Diagnosis not present

## 2016-06-12 DIAGNOSIS — J301 Allergic rhinitis due to pollen: Secondary | ICD-10-CM | POA: Diagnosis not present

## 2016-06-20 DIAGNOSIS — J3089 Other allergic rhinitis: Secondary | ICD-10-CM | POA: Diagnosis not present

## 2016-06-20 DIAGNOSIS — J301 Allergic rhinitis due to pollen: Secondary | ICD-10-CM | POA: Diagnosis not present

## 2016-06-28 DIAGNOSIS — J3089 Other allergic rhinitis: Secondary | ICD-10-CM | POA: Diagnosis not present

## 2016-06-28 DIAGNOSIS — J301 Allergic rhinitis due to pollen: Secondary | ICD-10-CM | POA: Diagnosis not present

## 2016-07-05 DIAGNOSIS — J3089 Other allergic rhinitis: Secondary | ICD-10-CM | POA: Diagnosis not present

## 2016-07-05 DIAGNOSIS — J301 Allergic rhinitis due to pollen: Secondary | ICD-10-CM | POA: Diagnosis not present

## 2016-07-10 DIAGNOSIS — N390 Urinary tract infection, site not specified: Secondary | ICD-10-CM | POA: Diagnosis not present

## 2016-07-11 DIAGNOSIS — L2089 Other atopic dermatitis: Secondary | ICD-10-CM | POA: Diagnosis not present

## 2016-07-11 DIAGNOSIS — H5213 Myopia, bilateral: Secondary | ICD-10-CM | POA: Diagnosis not present

## 2016-07-11 DIAGNOSIS — L282 Other prurigo: Secondary | ICD-10-CM | POA: Diagnosis not present

## 2016-07-13 DIAGNOSIS — J301 Allergic rhinitis due to pollen: Secondary | ICD-10-CM | POA: Diagnosis not present

## 2016-07-13 DIAGNOSIS — J3089 Other allergic rhinitis: Secondary | ICD-10-CM | POA: Diagnosis not present

## 2016-07-20 DIAGNOSIS — J3089 Other allergic rhinitis: Secondary | ICD-10-CM | POA: Diagnosis not present

## 2016-07-20 DIAGNOSIS — J301 Allergic rhinitis due to pollen: Secondary | ICD-10-CM | POA: Diagnosis not present

## 2016-07-27 DIAGNOSIS — J301 Allergic rhinitis due to pollen: Secondary | ICD-10-CM | POA: Diagnosis not present

## 2016-07-27 DIAGNOSIS — J3089 Other allergic rhinitis: Secondary | ICD-10-CM | POA: Diagnosis not present

## 2016-07-30 DIAGNOSIS — Z114 Encounter for screening for human immunodeficiency virus [HIV]: Secondary | ICD-10-CM | POA: Diagnosis not present

## 2016-07-30 DIAGNOSIS — R3915 Urgency of urination: Secondary | ICD-10-CM | POA: Diagnosis not present

## 2016-07-30 DIAGNOSIS — Z6822 Body mass index (BMI) 22.0-22.9, adult: Secondary | ICD-10-CM | POA: Diagnosis not present

## 2016-07-30 DIAGNOSIS — Z118 Encounter for screening for other infectious and parasitic diseases: Secondary | ICD-10-CM | POA: Diagnosis not present

## 2016-07-30 DIAGNOSIS — Z1159 Encounter for screening for other viral diseases: Secondary | ICD-10-CM | POA: Diagnosis not present

## 2016-07-30 DIAGNOSIS — Z1151 Encounter for screening for human papillomavirus (HPV): Secondary | ICD-10-CM | POA: Diagnosis not present

## 2016-07-30 DIAGNOSIS — Z01419 Encounter for gynecological examination (general) (routine) without abnormal findings: Secondary | ICD-10-CM | POA: Diagnosis not present

## 2016-07-30 DIAGNOSIS — R350 Frequency of micturition: Secondary | ICD-10-CM | POA: Diagnosis not present

## 2016-07-30 DIAGNOSIS — Z113 Encounter for screening for infections with a predominantly sexual mode of transmission: Secondary | ICD-10-CM | POA: Diagnosis not present

## 2016-08-01 DIAGNOSIS — J301 Allergic rhinitis due to pollen: Secondary | ICD-10-CM | POA: Diagnosis not present

## 2016-08-01 DIAGNOSIS — J3089 Other allergic rhinitis: Secondary | ICD-10-CM | POA: Diagnosis not present

## 2016-08-15 DIAGNOSIS — B373 Candidiasis of vulva and vagina: Secondary | ICD-10-CM | POA: Diagnosis not present

## 2016-08-15 DIAGNOSIS — J3089 Other allergic rhinitis: Secondary | ICD-10-CM | POA: Diagnosis not present

## 2016-08-15 DIAGNOSIS — J301 Allergic rhinitis due to pollen: Secondary | ICD-10-CM | POA: Diagnosis not present

## 2016-08-22 DIAGNOSIS — J301 Allergic rhinitis due to pollen: Secondary | ICD-10-CM | POA: Diagnosis not present

## 2016-08-22 DIAGNOSIS — J3089 Other allergic rhinitis: Secondary | ICD-10-CM | POA: Diagnosis not present

## 2016-09-05 DIAGNOSIS — J301 Allergic rhinitis due to pollen: Secondary | ICD-10-CM | POA: Diagnosis not present

## 2016-09-05 DIAGNOSIS — J3089 Other allergic rhinitis: Secondary | ICD-10-CM | POA: Diagnosis not present

## 2016-09-13 DIAGNOSIS — J301 Allergic rhinitis due to pollen: Secondary | ICD-10-CM | POA: Diagnosis not present

## 2016-09-21 DIAGNOSIS — J3089 Other allergic rhinitis: Secondary | ICD-10-CM | POA: Diagnosis not present

## 2016-09-21 DIAGNOSIS — J301 Allergic rhinitis due to pollen: Secondary | ICD-10-CM | POA: Diagnosis not present

## 2016-10-03 DIAGNOSIS — J301 Allergic rhinitis due to pollen: Secondary | ICD-10-CM | POA: Diagnosis not present

## 2016-10-03 DIAGNOSIS — J3089 Other allergic rhinitis: Secondary | ICD-10-CM | POA: Diagnosis not present

## 2016-10-19 ENCOUNTER — Ambulatory Visit (INDEPENDENT_AMBULATORY_CARE_PROVIDER_SITE_OTHER): Payer: BLUE CROSS/BLUE SHIELD | Admitting: Family Medicine

## 2016-10-19 ENCOUNTER — Encounter: Payer: Self-pay | Admitting: Family Medicine

## 2016-10-19 VITALS — BP 108/64 | HR 78 | Temp 98.0°F | Resp 16 | Ht 70.47 in | Wt 159.0 lb

## 2016-10-19 DIAGNOSIS — J301 Allergic rhinitis due to pollen: Secondary | ICD-10-CM | POA: Diagnosis not present

## 2016-10-19 DIAGNOSIS — J019 Acute sinusitis, unspecified: Secondary | ICD-10-CM | POA: Diagnosis not present

## 2016-10-19 DIAGNOSIS — J3089 Other allergic rhinitis: Secondary | ICD-10-CM | POA: Diagnosis not present

## 2016-10-19 MED ORDER — AMOXICILLIN-POT CLAVULANATE 875-125 MG PO TABS: 1.0000 | ORAL_TABLET | Freq: Two times a day (BID) | ORAL | 0 refills | Status: DC

## 2016-10-19 MED ORDER — FLUCONAZOLE 150 MG PO TABS: 150.0000 mg | ORAL_TABLET | Freq: Once | ORAL | 0 refills | Status: AC

## 2016-10-19 NOTE — Patient Instructions (Addendum)
Sinusitis, Adult Sinusitis is soreness and inflammation of your sinuses. Sinuses are hollow spaces in the bones around your face. Your sinuses are located:  Around your eyes.  In the middle of your forehead.  Behind your nose.  In your cheekbones.  Your sinuses and nasal passages are lined with a stringy fluid (mucus). Mucus normally drains out of your sinuses. When your nasal tissues become inflamed or swollen, the mucus can become trapped or blocked so air cannot flow through your sinuses. This allows bacteria, viruses, and funguses to grow, which leads to infection. Sinusitis can develop quickly and last for 7?10 days (acute) or for more than 12 weeks (chronic). Sinusitis often develops after a cold. What are the causes? This condition is caused by anything that creates swelling in the sinuses or stops mucus from draining, including:  Allergies.  Asthma.  Bacterial or viral infection.  Abnormally shaped bones between the nasal passages.  Nasal growths that contain mucus (nasal polyps).  Narrow sinus openings.  Pollutants, such as chemicals or irritants in the air.  A foreign object stuck in the nose.  A fungal infection. This is rare.  What increases the risk? The following factors may make you more likely to develop this condition:  Having allergies or asthma.  Having had a recent cold or respiratory tract infection.  Having structural deformities or blockages in your nose or sinuses.  Having a weak immune system.  Doing a lot of swimming or diving.  Overusing nasal sprays.  Smoking.  What are the signs or symptoms? The main symptoms of this condition are pain and a feeling of pressure around the affected sinuses. Other symptoms include:  Upper toothache.  Earache.  Headache.  Bad breath.  Decreased sense of smell and taste.  A cough that may get worse at night.  Fatigue.  Fever.  Thick drainage from your nose. The drainage is often green  and it may contain pus (purulent).  Stuffy nose or congestion.  Postnasal drip. This is when extra mucus collects in the throat or back of the nose.  Swelling and warmth over the affected sinuses.  Sore throat.  Sensitivity to light.  How is this diagnosed? This condition is diagnosed based on symptoms, a medical history, and a physical exam. To find out if your condition is acute or chronic, your health care provider may:  Look in your nose for signs of nasal polyps.  Tap over the affected sinus to check for signs of infection.  View the inside of your sinuses using an imaging device that has a light attached (endoscope).  If your health care provider suspects that you have chronic sinusitis, you may also:  Be tested for allergies.  Have a sample of mucus taken from your nose (nasal culture) and checked for bacteria.  Have a mucus sample examined to see if your sinusitis is related to an allergy.  If your sinusitis does not respond to treatment and it lasts longer than 8 weeks, you may have an MRI or CT scan to check your sinuses. These scans also help to determine how severe your infection is. In rare cases, a bone biopsy may be done to rule out more serious types of fungal sinus disease. How is this treated? Treatment for sinusitis depends on the cause and whether your condition is chronic or acute. If a virus is causing your sinusitis, your symptoms will go away on their own within 10 days. You may be given medicines to relieve your  symptoms, including:  Topical nasal decongestants. They shrink swollen nasal passages and let mucus drain from your sinuses.  Antihistamines. These drugs block inflammation that is triggered by allergies. This can help to ease swelling in your nose and sinuses.  Topical nasal corticosteroids. These are nasal sprays that ease inflammation and swelling in your nose and sinuses.  Nasal saline washes. These rinses can help to get rid of thick mucus  in your nose.  If your condition is caused by bacteria, you will be given an antibiotic medicine. If your condition is caused by a fungus, you will be given an antifungal medicine. Surgery may be needed to correct underlying conditions, such as narrow nasal passages. Surgery may also be needed to remove polyps. Follow these instructions at home: Medicines  Take, use, or apply over-the-counter and prescription medicines only as told by your health care provider. These may include nasal sprays.  If you were prescribed an antibiotic medicine, take it as told by your health care provider. Do not stop taking the antibiotic even if you start to feel better. Hydrate and Humidify  Drink enough water to keep your urine clear or pale yellow. Staying hydrated will help to thin your mucus.  Use a cool mist humidifier to keep the humidity level in your home above 50%.  Inhale steam for 10-15 minutes, 3-4 times a day or as told by your health care provider. You can do this in the bathroom while a hot shower is running.  Limit your exposure to cool or dry air. Rest  Rest as much as possible.  Sleep with your head raised (elevated).  Make sure to get enough sleep each night. General instructions  Apply a warm, moist washcloth to your face 3-4 times a day or as told by your health care provider. This will help with discomfort.  Wash your hands often with soap and water to reduce your exposure to viruses and other germs. If soap and water are not available, use hand sanitizer.  Do not smoke. Avoid being around people who are smoking (secondhand smoke).  Keep all follow-up visits as told by your health care provider. This is important. Contact a health care provider if:  You have a fever.  Your symptoms get worse.  Your symptoms do not improve within 10 days. Get help right away if:  You have a severe headache.  You have persistent vomiting.  You have pain or swelling around your face or  eyes.  You have vision problems.  You develop confusion.  Your neck is stiff.  You have trouble breathing. This information is not intended to replace advice given to you by your health care provider. Make sure you discuss any questions you have with your health care provider. Document Released: 01/01/2005 Document Revised: 08/28/2015 Document Reviewed: 10/27/2014 Elsevier Interactive Patient Education  2017 [employer].    IF you received an x-ray today, you will receive an invoice from [facility]. Please contact [facility] at [phone] with questions or concerns regarding your invoice.   IF you received labwork today, you will receive an invoice from [facility]. Please contact [facility] at [phone] with questions or concerns regarding your invoice.   Our billing staff will not be able to assist you with questions regarding bills from these companies.  You will be contacted with the lab results as soon as they are available. The fastest way to get your results is to activate your My Chart account. Instructions are located on the last page  of this paperwork. If you have not heard from [place] regarding the results in 2 weeks, please contact this office.
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
## 2016-10-31 DIAGNOSIS — J310 Chronic rhinitis: Secondary | ICD-10-CM | POA: Diagnosis not present

## 2016-10-31 DIAGNOSIS — J343 Hypertrophy of nasal turbinates: Secondary | ICD-10-CM | POA: Diagnosis not present

## 2016-10-31 DIAGNOSIS — J342 Deviated nasal septum: Secondary | ICD-10-CM | POA: Diagnosis not present

## 2016-11-02 DIAGNOSIS — J3089 Other allergic rhinitis: Secondary | ICD-10-CM | POA: Diagnosis not present

## 2016-11-02 DIAGNOSIS — J301 Allergic rhinitis due to pollen: Secondary | ICD-10-CM | POA: Diagnosis not present

## 2016-11-05 DIAGNOSIS — J3089 Other allergic rhinitis: Secondary | ICD-10-CM | POA: Diagnosis not present

## 2016-11-07 DIAGNOSIS — J301 Allergic rhinitis due to pollen: Secondary | ICD-10-CM | POA: Diagnosis not present

## 2016-11-07 DIAGNOSIS — J3089 Other allergic rhinitis: Secondary | ICD-10-CM | POA: Diagnosis not present

## 2016-11-14 DIAGNOSIS — J301 Allergic rhinitis due to pollen: Secondary | ICD-10-CM | POA: Diagnosis not present

## 2016-11-14 DIAGNOSIS — J3089 Other allergic rhinitis: Secondary | ICD-10-CM | POA: Diagnosis not present

## 2016-11-21 DIAGNOSIS — J3089 Other allergic rhinitis: Secondary | ICD-10-CM | POA: Diagnosis not present

## 2016-11-21 DIAGNOSIS — J301 Allergic rhinitis due to pollen: Secondary | ICD-10-CM | POA: Diagnosis not present

## 2016-11-28 DIAGNOSIS — J301 Allergic rhinitis due to pollen: Secondary | ICD-10-CM | POA: Diagnosis not present

## 2016-11-28 DIAGNOSIS — J3089 Other allergic rhinitis: Secondary | ICD-10-CM | POA: Diagnosis not present

## 2016-12-12 DIAGNOSIS — H1045 Other chronic allergic conjunctivitis: Secondary | ICD-10-CM | POA: Diagnosis not present

## 2016-12-12 DIAGNOSIS — J301 Allergic rhinitis due to pollen: Secondary | ICD-10-CM | POA: Diagnosis not present

## 2016-12-12 DIAGNOSIS — J3089 Other allergic rhinitis: Secondary | ICD-10-CM | POA: Diagnosis not present

## 2016-12-19 DIAGNOSIS — J309 Allergic rhinitis, unspecified: Secondary | ICD-10-CM

## 2016-12-19 DIAGNOSIS — J3089 Other allergic rhinitis: Secondary | ICD-10-CM | POA: Diagnosis not present

## 2016-12-19 DIAGNOSIS — J301 Allergic rhinitis due to pollen: Secondary | ICD-10-CM | POA: Diagnosis not present

## 2017-01-09 DIAGNOSIS — J3089 Other allergic rhinitis: Secondary | ICD-10-CM | POA: Diagnosis not present

## 2017-01-09 DIAGNOSIS — J301 Allergic rhinitis due to pollen: Secondary | ICD-10-CM | POA: Diagnosis not present

## 2017-01-17 DIAGNOSIS — J3089 Other allergic rhinitis: Secondary | ICD-10-CM | POA: Diagnosis not present

## 2017-01-17 DIAGNOSIS — J301 Allergic rhinitis due to pollen: Secondary | ICD-10-CM | POA: Diagnosis not present

## 2017-01-23 DIAGNOSIS — J301 Allergic rhinitis due to pollen: Secondary | ICD-10-CM | POA: Diagnosis not present

## 2017-01-23 DIAGNOSIS — J3089 Other allergic rhinitis: Secondary | ICD-10-CM | POA: Diagnosis not present

## 2017-02-01 DIAGNOSIS — J301 Allergic rhinitis due to pollen: Secondary | ICD-10-CM | POA: Diagnosis not present

## 2017-02-01 DIAGNOSIS — J3089 Other allergic rhinitis: Secondary | ICD-10-CM | POA: Diagnosis not present

## 2017-02-01 DIAGNOSIS — J3081 Allergic rhinitis due to animal (cat) (dog) hair and dander: Secondary | ICD-10-CM | POA: Diagnosis not present

## 2017-02-07 DIAGNOSIS — J3089 Other allergic rhinitis: Secondary | ICD-10-CM | POA: Diagnosis not present

## 2017-02-07 DIAGNOSIS — J301 Allergic rhinitis due to pollen: Secondary | ICD-10-CM | POA: Diagnosis not present

## 2017-02-15 DIAGNOSIS — J301 Allergic rhinitis due to pollen: Secondary | ICD-10-CM | POA: Diagnosis not present

## 2017-02-15 DIAGNOSIS — J3089 Other allergic rhinitis: Secondary | ICD-10-CM | POA: Diagnosis not present

## 2017-02-21 DIAGNOSIS — J301 Allergic rhinitis due to pollen: Secondary | ICD-10-CM | POA: Diagnosis not present

## 2017-02-21 DIAGNOSIS — J3089 Other allergic rhinitis: Secondary | ICD-10-CM | POA: Diagnosis not present

## 2017-03-07 DIAGNOSIS — J301 Allergic rhinitis due to pollen: Secondary | ICD-10-CM | POA: Diagnosis not present

## 2017-03-07 DIAGNOSIS — J3089 Other allergic rhinitis: Secondary | ICD-10-CM | POA: Diagnosis not present

## 2017-03-14 DIAGNOSIS — J301 Allergic rhinitis due to pollen: Secondary | ICD-10-CM | POA: Diagnosis not present

## 2017-03-14 DIAGNOSIS — J3089 Other allergic rhinitis: Secondary | ICD-10-CM | POA: Diagnosis not present

## 2017-03-27 DIAGNOSIS — J3089 Other allergic rhinitis: Secondary | ICD-10-CM | POA: Diagnosis not present

## 2017-03-27 DIAGNOSIS — J301 Allergic rhinitis due to pollen: Secondary | ICD-10-CM | POA: Diagnosis not present

## 2017-04-02 DIAGNOSIS — R040 Epistaxis: Secondary | ICD-10-CM | POA: Diagnosis not present

## 2017-04-04 DIAGNOSIS — J301 Allergic rhinitis due to pollen: Secondary | ICD-10-CM | POA: Diagnosis not present

## 2017-04-04 DIAGNOSIS — J3089 Other allergic rhinitis: Secondary | ICD-10-CM | POA: Diagnosis not present

## 2017-04-10 DIAGNOSIS — J3089 Other allergic rhinitis: Secondary | ICD-10-CM | POA: Diagnosis not present

## 2017-04-10 DIAGNOSIS — J301 Allergic rhinitis due to pollen: Secondary | ICD-10-CM | POA: Diagnosis not present

## 2017-04-11 DIAGNOSIS — J301 Allergic rhinitis due to pollen: Secondary | ICD-10-CM | POA: Diagnosis not present

## 2017-04-24 DIAGNOSIS — J3089 Other allergic rhinitis: Secondary | ICD-10-CM | POA: Diagnosis not present

## 2017-04-24 DIAGNOSIS — J301 Allergic rhinitis due to pollen: Secondary | ICD-10-CM | POA: Diagnosis not present

## 2017-04-30 DIAGNOSIS — R040 Epistaxis: Secondary | ICD-10-CM | POA: Diagnosis not present

## 2017-05-06 DIAGNOSIS — J3089 Other allergic rhinitis: Secondary | ICD-10-CM | POA: Diagnosis not present

## 2017-05-08 DIAGNOSIS — J3089 Other allergic rhinitis: Secondary | ICD-10-CM | POA: Diagnosis not present

## 2017-05-08 DIAGNOSIS — J301 Allergic rhinitis due to pollen: Secondary | ICD-10-CM | POA: Diagnosis not present

## 2017-05-22 DIAGNOSIS — J301 Allergic rhinitis due to pollen: Secondary | ICD-10-CM | POA: Diagnosis not present

## 2017-05-22 DIAGNOSIS — J3081 Allergic rhinitis due to animal (cat) (dog) hair and dander: Secondary | ICD-10-CM | POA: Diagnosis not present

## 2017-05-22 DIAGNOSIS — J3089 Other allergic rhinitis: Secondary | ICD-10-CM | POA: Diagnosis not present

## 2017-05-28 DIAGNOSIS — Z711 Person with feared health complaint in whom no diagnosis is made: Secondary | ICD-10-CM | POA: Diagnosis not present

## 2017-05-30 DIAGNOSIS — J301 Allergic rhinitis due to pollen: Secondary | ICD-10-CM | POA: Diagnosis not present

## 2017-05-30 DIAGNOSIS — J3089 Other allergic rhinitis: Secondary | ICD-10-CM | POA: Diagnosis not present

## 2017-06-05 DIAGNOSIS — J301 Allergic rhinitis due to pollen: Secondary | ICD-10-CM | POA: Diagnosis not present

## 2017-06-05 DIAGNOSIS — J3089 Other allergic rhinitis: Secondary | ICD-10-CM | POA: Diagnosis not present

## 2017-06-12 DIAGNOSIS — R040 Epistaxis: Secondary | ICD-10-CM | POA: Diagnosis not present

## 2017-06-13 DIAGNOSIS — J301 Allergic rhinitis due to pollen: Secondary | ICD-10-CM | POA: Diagnosis not present

## 2017-06-13 DIAGNOSIS — J3089 Other allergic rhinitis: Secondary | ICD-10-CM | POA: Diagnosis not present

## 2017-06-19 DIAGNOSIS — J3089 Other allergic rhinitis: Secondary | ICD-10-CM | POA: Diagnosis not present

## 2017-06-19 DIAGNOSIS — J301 Allergic rhinitis due to pollen: Secondary | ICD-10-CM | POA: Diagnosis not present

## 2017-07-03 DIAGNOSIS — J3089 Other allergic rhinitis: Secondary | ICD-10-CM | POA: Diagnosis not present

## 2017-07-03 DIAGNOSIS — J301 Allergic rhinitis due to pollen: Secondary | ICD-10-CM | POA: Diagnosis not present

## 2017-07-11 DIAGNOSIS — J301 Allergic rhinitis due to pollen: Secondary | ICD-10-CM | POA: Diagnosis not present

## 2017-07-11 DIAGNOSIS — J3089 Other allergic rhinitis: Secondary | ICD-10-CM | POA: Diagnosis not present

## 2017-07-17 DIAGNOSIS — J3089 Other allergic rhinitis: Secondary | ICD-10-CM | POA: Diagnosis not present

## 2017-07-17 DIAGNOSIS — J301 Allergic rhinitis due to pollen: Secondary | ICD-10-CM | POA: Diagnosis not present

## 2017-07-18 DIAGNOSIS — H5213 Myopia, bilateral: Secondary | ICD-10-CM | POA: Diagnosis not present

## 2017-07-24 DIAGNOSIS — J3089 Other allergic rhinitis: Secondary | ICD-10-CM | POA: Diagnosis not present

## 2017-07-24 DIAGNOSIS — J301 Allergic rhinitis due to pollen: Secondary | ICD-10-CM | POA: Diagnosis not present

## 2017-07-31 DIAGNOSIS — J3089 Other allergic rhinitis: Secondary | ICD-10-CM | POA: Diagnosis not present

## 2017-07-31 DIAGNOSIS — J301 Allergic rhinitis due to pollen: Secondary | ICD-10-CM | POA: Diagnosis not present

## 2017-08-01 DIAGNOSIS — Z114 Encounter for screening for human immunodeficiency virus [HIV]: Secondary | ICD-10-CM | POA: Diagnosis not present

## 2017-08-01 DIAGNOSIS — Z01419 Encounter for gynecological examination (general) (routine) without abnormal findings: Secondary | ICD-10-CM | POA: Diagnosis not present

## 2017-08-01 DIAGNOSIS — Z0000 Encounter for general adult medical examination without abnormal findings: Secondary | ICD-10-CM | POA: Diagnosis not present

## 2017-08-01 DIAGNOSIS — Z1329 Encounter for screening for other suspected endocrine disorder: Secondary | ICD-10-CM | POA: Diagnosis not present

## 2017-08-01 DIAGNOSIS — Z6821 Body mass index (BMI) 21.0-21.9, adult: Secondary | ICD-10-CM | POA: Diagnosis not present

## 2017-08-01 DIAGNOSIS — Z118 Encounter for screening for other infectious and parasitic diseases: Secondary | ICD-10-CM | POA: Diagnosis not present

## 2017-08-01 DIAGNOSIS — Z113 Encounter for screening for infections with a predominantly sexual mode of transmission: Secondary | ICD-10-CM | POA: Diagnosis not present

## 2017-08-01 DIAGNOSIS — Z130 Encounter for screening for diseases of the blood and blood-forming organs and certain disorders involving the immune mechanism: Secondary | ICD-10-CM | POA: Diagnosis not present

## 2017-08-01 DIAGNOSIS — Z1231 Encounter for screening mammogram for malignant neoplasm of breast: Secondary | ICD-10-CM | POA: Diagnosis not present

## 2017-08-01 DIAGNOSIS — Z1159 Encounter for screening for other viral diseases: Secondary | ICD-10-CM | POA: Diagnosis not present

## 2017-08-07 DIAGNOSIS — J3089 Other allergic rhinitis: Secondary | ICD-10-CM | POA: Diagnosis not present

## 2017-08-07 DIAGNOSIS — J301 Allergic rhinitis due to pollen: Secondary | ICD-10-CM | POA: Diagnosis not present

## 2017-08-16 DIAGNOSIS — J301 Allergic rhinitis due to pollen: Secondary | ICD-10-CM | POA: Diagnosis not present

## 2017-08-16 DIAGNOSIS — J3089 Other allergic rhinitis: Secondary | ICD-10-CM | POA: Diagnosis not present

## 2017-08-21 DIAGNOSIS — J3089 Other allergic rhinitis: Secondary | ICD-10-CM | POA: Diagnosis not present

## 2017-08-21 DIAGNOSIS — J301 Allergic rhinitis due to pollen: Secondary | ICD-10-CM | POA: Diagnosis not present

## 2017-08-28 DIAGNOSIS — J3089 Other allergic rhinitis: Secondary | ICD-10-CM | POA: Diagnosis not present

## 2017-08-28 DIAGNOSIS — J301 Allergic rhinitis due to pollen: Secondary | ICD-10-CM | POA: Diagnosis not present

## 2017-09-06 DIAGNOSIS — J301 Allergic rhinitis due to pollen: Secondary | ICD-10-CM | POA: Diagnosis not present

## 2017-09-06 DIAGNOSIS — J3089 Other allergic rhinitis: Secondary | ICD-10-CM | POA: Diagnosis not present

## 2017-09-11 DIAGNOSIS — J3089 Other allergic rhinitis: Secondary | ICD-10-CM | POA: Diagnosis not present

## 2017-09-11 DIAGNOSIS — J301 Allergic rhinitis due to pollen: Secondary | ICD-10-CM | POA: Diagnosis not present

## 2017-09-18 DIAGNOSIS — J301 Allergic rhinitis due to pollen: Secondary | ICD-10-CM | POA: Diagnosis not present

## 2017-09-18 DIAGNOSIS — J3089 Other allergic rhinitis: Secondary | ICD-10-CM | POA: Diagnosis not present

## 2017-10-02 DIAGNOSIS — J301 Allergic rhinitis due to pollen: Secondary | ICD-10-CM | POA: Diagnosis not present

## 2017-10-02 DIAGNOSIS — J3089 Other allergic rhinitis: Secondary | ICD-10-CM | POA: Diagnosis not present

## 2017-10-16 DIAGNOSIS — J301 Allergic rhinitis due to pollen: Secondary | ICD-10-CM | POA: Diagnosis not present

## 2017-10-16 DIAGNOSIS — J3089 Other allergic rhinitis: Secondary | ICD-10-CM | POA: Diagnosis not present

## 2017-10-22 DIAGNOSIS — J3089 Other allergic rhinitis: Secondary | ICD-10-CM | POA: Diagnosis not present

## 2017-10-22 DIAGNOSIS — J301 Allergic rhinitis due to pollen: Secondary | ICD-10-CM | POA: Diagnosis not present

## 2017-10-31 DIAGNOSIS — J3089 Other allergic rhinitis: Secondary | ICD-10-CM | POA: Diagnosis not present

## 2017-10-31 DIAGNOSIS — J301 Allergic rhinitis due to pollen: Secondary | ICD-10-CM | POA: Diagnosis not present

## 2017-11-08 DIAGNOSIS — J3089 Other allergic rhinitis: Secondary | ICD-10-CM | POA: Diagnosis not present

## 2017-11-08 DIAGNOSIS — J301 Allergic rhinitis due to pollen: Secondary | ICD-10-CM | POA: Diagnosis not present

## 2017-11-13 DIAGNOSIS — J3089 Other allergic rhinitis: Secondary | ICD-10-CM | POA: Diagnosis not present

## 2017-11-13 DIAGNOSIS — J301 Allergic rhinitis due to pollen: Secondary | ICD-10-CM | POA: Diagnosis not present

## 2017-11-26 DIAGNOSIS — J3089 Other allergic rhinitis: Secondary | ICD-10-CM | POA: Diagnosis not present

## 2017-11-26 DIAGNOSIS — J301 Allergic rhinitis due to pollen: Secondary | ICD-10-CM | POA: Diagnosis not present

## 2017-12-04 DIAGNOSIS — J3089 Other allergic rhinitis: Secondary | ICD-10-CM | POA: Diagnosis not present

## 2017-12-04 DIAGNOSIS — J301 Allergic rhinitis due to pollen: Secondary | ICD-10-CM | POA: Diagnosis not present

## 2017-12-10 DIAGNOSIS — J3089 Other allergic rhinitis: Secondary | ICD-10-CM | POA: Diagnosis not present

## 2017-12-10 DIAGNOSIS — J301 Allergic rhinitis due to pollen: Secondary | ICD-10-CM | POA: Diagnosis not present

## 2017-12-20 DIAGNOSIS — J301 Allergic rhinitis due to pollen: Secondary | ICD-10-CM | POA: Diagnosis not present

## 2017-12-20 DIAGNOSIS — J3089 Other allergic rhinitis: Secondary | ICD-10-CM | POA: Diagnosis not present

## 2017-12-24 DIAGNOSIS — J3089 Other allergic rhinitis: Secondary | ICD-10-CM | POA: Diagnosis not present

## 2017-12-27 DIAGNOSIS — J3089 Other allergic rhinitis: Secondary | ICD-10-CM | POA: Diagnosis not present

## 2017-12-27 DIAGNOSIS — J301 Allergic rhinitis due to pollen: Secondary | ICD-10-CM | POA: Diagnosis not present

## 2018-01-02 DIAGNOSIS — J301 Allergic rhinitis due to pollen: Secondary | ICD-10-CM | POA: Diagnosis not present

## 2018-01-02 DIAGNOSIS — J3089 Other allergic rhinitis: Secondary | ICD-10-CM | POA: Diagnosis not present

## 2018-01-16 DIAGNOSIS — J301 Allergic rhinitis due to pollen: Secondary | ICD-10-CM | POA: Diagnosis not present

## 2018-01-16 DIAGNOSIS — J3089 Other allergic rhinitis: Secondary | ICD-10-CM | POA: Diagnosis not present

## 2018-01-29 DIAGNOSIS — J3089 Other allergic rhinitis: Secondary | ICD-10-CM | POA: Diagnosis not present

## 2018-01-29 DIAGNOSIS — J301 Allergic rhinitis due to pollen: Secondary | ICD-10-CM | POA: Diagnosis not present

## 2018-06-23 DIAGNOSIS — Z20828 Contact with and (suspected) exposure to other viral communicable diseases: Secondary | ICD-10-CM | POA: Diagnosis not present

## 2018-07-03 ENCOUNTER — Emergency Department (HOSPITAL_COMMUNITY): Payer: BC Managed Care – PPO

## 2018-07-03 ENCOUNTER — Inpatient Hospital Stay (HOSPITAL_COMMUNITY)
Admission: EM | Admit: 2018-07-03 | Discharge: 2018-07-04 | DRG: 177 | Disposition: A | Discharge status: Home / Self Care | Payer: BC Managed Care – PPO | Source: Ambulatory Visit | Attending: Family Medicine | Admitting: Family Medicine

## 2018-07-03 ENCOUNTER — Encounter (HOSPITAL_COMMUNITY): Payer: Self-pay | Admitting: Emergency Medicine

## 2018-07-03 ENCOUNTER — Other Ambulatory Visit: Payer: Self-pay

## 2018-07-03 DIAGNOSIS — R079 Chest pain, unspecified: Secondary | ICD-10-CM | POA: Diagnosis not present

## 2018-07-03 DIAGNOSIS — D649 Anemia, unspecified: Secondary | ICD-10-CM | POA: Diagnosis present

## 2018-07-03 DIAGNOSIS — U071 COVID-19: Secondary | ICD-10-CM | POA: Diagnosis not present

## 2018-07-03 DIAGNOSIS — Z833 Family history of diabetes mellitus: Secondary | ICD-10-CM | POA: Diagnosis not present

## 2018-07-03 DIAGNOSIS — R002 Palpitations: Secondary | ICD-10-CM | POA: Diagnosis not present

## 2018-07-03 DIAGNOSIS — Z8249 Family history of ischemic heart disease and other diseases of the circulatory system: Secondary | ICD-10-CM | POA: Diagnosis not present

## 2018-07-03 DIAGNOSIS — R0602 Shortness of breath: Secondary | ICD-10-CM | POA: Diagnosis not present

## 2018-07-03 DIAGNOSIS — J189 Pneumonia, unspecified organism: Secondary | ICD-10-CM | POA: Diagnosis present

## 2018-07-03 DIAGNOSIS — J309 Allergic rhinitis, unspecified: Secondary | ICD-10-CM | POA: Diagnosis present

## 2018-07-03 DIAGNOSIS — J1289 Other viral pneumonia: Secondary | ICD-10-CM | POA: Diagnosis not present

## 2018-07-03 DIAGNOSIS — J168 Pneumonia due to other specified infectious organisms: Secondary | ICD-10-CM | POA: Diagnosis not present

## 2018-07-03 DIAGNOSIS — R000 Tachycardia, unspecified: Secondary | ICD-10-CM | POA: Diagnosis present

## 2018-07-03 LAB — BASIC METABOLIC PANEL WITH GFR
Anion gap: 10 (ref 5–15)
BUN: 9 mg/dL (ref 6–20)
CO2: 24 mmol/L (ref 22–32)
Calcium: 9.5 mg/dL (ref 8.9–10.3)
Chloride: 101 mmol/L (ref 98–111)
Creatinine, Ser: 0.73 mg/dL (ref 0.44–1.00)
GFR calc Af Amer: >60 mL/min
GFR calc non Af Amer: >60 mL/min
Glucose, Bld: 105 mg/dL — ABNORMAL HIGH (ref 70–99)
Potassium: 3.6 mmol/L (ref 3.5–5.1)
Sodium: 135 mmol/L (ref 135–145)

## 2018-07-03 LAB — CBC WITH DIFFERENTIAL/PLATELET
Abs Immature Granulocytes: 0.02 10*3/uL (ref 0.00–0.07)
Basophils Absolute: 0 10*3/uL (ref 0.0–0.1)
Basophils Relative: 1 %
Eosinophils Absolute: 0 10*3/uL (ref 0.0–0.5)
Eosinophils Relative: 1 %
HCT: 33.8 % — ABNORMAL LOW (ref 36.0–46.0)
Hemoglobin: 11.1 g/dL — ABNORMAL LOW (ref 12.0–15.0)
Immature Granulocytes: 1 %
Lymphocytes Relative: 26 %
Lymphs Abs: 1.1 10*3/uL (ref 0.7–4.0)
MCH: 31.3 pg (ref 26.0–34.0)
MCHC: 32.8 g/dL (ref 30.0–36.0)
MCV: 95.2 fL (ref 80.0–100.0)
Monocytes Absolute: 0.6 10*3/uL (ref 0.1–1.0)
Monocytes Relative: 14 %
Neutro Abs: 2.5 10*3/uL (ref 1.7–7.7)
Neutrophils Relative %: 57 %
Platelets: 320 10*3/uL (ref 150–400)
RBC: 3.55 MIL/uL — ABNORMAL LOW (ref 3.87–5.11)
RDW: 12.4 % (ref 11.5–15.5)
WBC: 4.2 10*3/uL (ref 4.0–10.5)
nRBC: 0 % (ref 0.0–0.2)

## 2018-07-03 LAB — I-STAT BETA HCG BLOOD, ED (MC, WL, AP ONLY): I-stat hCG, quantitative: <5 m[IU]/mL (ref <5)

## 2018-07-03 LAB — D-DIMER, QUANTITATIVE: D-Dimer, Quant: 0.4 ug{FEU}/mL (ref 0.00–0.50)

## 2018-07-03 LAB — MAGNESIUM: Magnesium: 1.9 mg/dL (ref 1.7–2.4)

## 2018-07-03 LAB — TSH: TSH: 3.461 u[IU]/mL (ref 0.350–4.500)

## 2018-07-03 LAB — RAPID URINE DRUG SCREEN, HOSP PERFORMED
Amphetamines: NOT DETECTED
Barbiturates: NOT DETECTED
Benzodiazepines: NOT DETECTED
Cocaine: NOT DETECTED
Opiates: NOT DETECTED
Tetrahydrocannabinol: NOT DETECTED

## 2018-07-03 LAB — TROPONIN I: Troponin I: <0.03 ng/mL (ref <0.03)

## 2018-07-03 MED ORDER — SODIUM CHLORIDE 0.9 % IV BOLUS
1000.0000 mL | Freq: Once | INTRAVENOUS | Status: AC
  Administered 2018-07-03: 1000 mL via INTRAVENOUS

## 2018-07-03 MED ORDER — SODIUM CHLORIDE 0.9 % IV SOLN
500.0000 mg | Freq: Once | INTRAVENOUS | Status: AC
  Administered 2018-07-04: 500 mg via INTRAVENOUS
  Filled 2018-07-03: qty 500

## 2018-07-03 MED ORDER — IOHEXOL 350 MG/ML SOLN
80.0000 mL | Freq: Once | INTRAVENOUS | Status: AC | PRN
  Administered 2018-07-03: 80 mL via INTRAVENOUS

## 2018-07-03 MED ORDER — SODIUM CHLORIDE 0.9 % IV SOLN
2.0000 g | Freq: Once | INTRAVENOUS | Status: AC
  Administered 2018-07-04: 2 g via INTRAVENOUS
  Filled 2018-07-03: qty 20

## 2018-07-03 NOTE — ED Notes (Signed)
Patient transported to CT 

## 2018-07-03 NOTE — ED Notes (Signed)
Pt tachycardic into the 150's upon ambulating 2 feet to [facility]. PA [facility] aware.

## 2018-07-03 NOTE — ED Notes (Signed)
Pt tachy to 148 upon ambulation to [facility] right beside her bed similar to earlier.  PA [place] aware.

## 2018-07-03 NOTE — ED Notes (Signed)
Pt failed to tell staff she was tested for COVID and has a POSITIVE test result from 06/25/18. Precautions initiated.

## 2018-07-03 NOTE — ED Notes (Addendum)
Per PA, pt tested positive for Covid at [facility].
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
## 2018-07-03 NOTE — ED Triage Notes (Signed)
Pt felt heart palpitations around 1400 with burning sensation in the center of her chest. Was seen at [facility] and told she could follow up with ER if she wanted to since they only did an EKG. States she thinks her heart has "slowed down" now.

## 2018-07-04 ENCOUNTER — Inpatient Hospital Stay (HOSPITAL_COMMUNITY): Payer: Self-pay

## 2018-07-04 DIAGNOSIS — R000 Tachycardia, unspecified: Secondary | ICD-10-CM

## 2018-07-04 LAB — COMPREHENSIVE METABOLIC PANEL
ALT: 11 U/L (ref 0–44)
AST: 19 U/L (ref 15–41)
Albumin: 4.1 g/dL (ref 3.5–5.0)
Alkaline Phosphatase: 42 U/L (ref 38–126)
Anion gap: 11 (ref 5–15)
BUN: 8 mg/dL (ref 6–20)
CO2: 25 mmol/L (ref 22–32)
Calcium: 9.2 mg/dL (ref 8.9–10.3)
Chloride: 102 mmol/L (ref 98–111)
Creatinine, Ser: 0.68 mg/dL (ref 0.44–1.00)
GFR calc Af Amer: >60 mL/min (ref >60)
GFR calc non Af Amer: >60 mL/min (ref >60)
Glucose, Bld: 145 mg/dL — ABNORMAL HIGH (ref 70–99)
Potassium: 3.7 mmol/L (ref 3.5–5.1)
Sodium: 138 mmol/L (ref 135–145)
Total Bilirubin: 0.5 mg/dL (ref 0.3–1.2)
Total Protein: 7.7 g/dL (ref 6.5–8.1)

## 2018-07-04 LAB — CBC
HCT: 32.3 % — ABNORMAL LOW (ref 36.0–46.0)
Hemoglobin: 10.7 g/dL — ABNORMAL LOW (ref 12.0–15.0)
MCH: 32 pg (ref 26.0–34.0)
MCHC: 33.1 g/dL (ref 30.0–36.0)
MCV: 96.7 fL (ref 80.0–100.0)
Platelets: 270 10*3/uL (ref 150–400)
RBC: 3.34 MIL/uL — ABNORMAL LOW (ref 3.87–5.11)
RDW: 12.7 % (ref 11.5–15.5)
WBC: 6 10*3/uL (ref 4.0–10.5)
nRBC: 0 % (ref 0.0–0.2)

## 2018-07-04 LAB — SARS CORONAVIRUS 2: SARS Coronavirus 2: DETECTED — AB

## 2018-07-04 MED ORDER — OLOPATADINE HCL 0.1 % OP SOLN: 1.0000 [drp] | Freq: Two times a day (BID) | OPHTHALMIC | Status: DC

## 2018-07-04 MED ORDER — AZELASTINE-FLUTICASONE 137-50 MCG/ACT NA SUSP: 1.0000 | Freq: Two times a day (BID) | NASAL | Status: DC | PRN

## 2018-07-04 MED ORDER — MONTELUKAST SODIUM 10 MG PO TABS: 10.0000 mg | ORAL_TABLET | Freq: Every day | ORAL | Status: DC

## 2018-07-04 MED ORDER — EPINEPHRINE 0.3 MG/0.3ML IJ SOAJ: 0.3000 mg | Freq: Every day | INTRAMUSCULAR | Status: DC | PRN

## 2018-07-04 MED ORDER — SODIUM CHLORIDE 0.9 % IV SOLN: INTRAVENOUS | Status: DC

## 2018-07-04 MED ORDER — SODIUM CHLORIDE 0.9 % IV SOLN: 500.0000 mg | INTRAVENOUS | Status: DC

## 2018-07-04 MED ORDER — SODIUM CHLORIDE 0.9 % IV SOLN: 2.0000 g | INTRAVENOUS | Status: DC

## 2018-07-04 MED ORDER — ENOXAPARIN SODIUM 40 MG/0.4ML ~~LOC~~ SOLN
40.0000 mg | SUBCUTANEOUS | Status: DC
  Administered 2018-07-04: 40 mg via SUBCUTANEOUS
  Filled 2018-07-04: qty 0.4

## 2018-07-04 MED ORDER — ADULT MULTIVITAMIN W/MINERALS CH
1.0000 | ORAL_TABLET | Freq: Every day | ORAL | Status: DC
  Administered 2018-07-04: 1 via ORAL
  Filled 2018-07-04: qty 1

## 2018-07-04 NOTE — ED Notes (Addendum)
Attempted to give report x2 

## 2018-07-04 NOTE — ED Notes (Signed)
CALLED [facility] TO TRANSPORT PATIENT TO [facility] ACCEPTING AND RECEIVING--LESLIE

## 2018-07-04 NOTE — Progress Notes (Addendum)
Attending cardiology review of echo order:  Due to the spread of COVID-19, departmental policy is to review orders for procedures and determine appropriateness regarding testing at this time. The following criteria are being used to limit potential exposure and spread of infection.  Is the patient being evaluated for COVID-19 infection: YES, POSITIVE  Is the patient actively having infectious respiratory symptoms or fever: yes, being treated for pneumonia Does the patient have a known history of prior cardiovascular disease: no Would the test change management of the patient: not immediately Can the test be performed at a later time: yes  Special circumstances: Is the patient undergoing evaluation for embolic CVA: no Is the patient planned for chemotherapy: no  Based on the review above, this study is not to be performed at this time.   Reviewed CT noting normal heart size. Only mild tachycardia in the setting of pneumonia. Hemodynamically stable. Sinus tach with mild PR depression (not ST elevation), not seen in all leads. ST segments equal to TP interval. Negative troponin. No pericardial effusion on CT. Once she has recovered this can be done as an outpatient. If her clinical status worsens, please contact the cardiologist on call and we will re-evaluate for necessity of testing.  Buford Dresser, MD, PhD [facility]  [address] [place], [place] [address] [phone]

## 2018-07-04 NOTE — Discharge Summary (Signed)
Physician Discharge Summary  Laura Montgomery:[id number] DOB: Mar 28, 1975 DOA: 07/03/2018  PCP: Patient, No Pcp Per  Admit date: 07/03/2018 Discharge date: 07/04/2018  Admitted From: Home  Disposition:  Home   Recommendations for Outpatient Follow-up:  1. Follow up with PCP in 1 week; repeat ECG at follow up and Cardiology referral if still concern regarding tachycardia or PR depression 2. PCP: please repeat Hgb and iron in 1-2 months     Home Health: None  Equipment/Devices: None  Discharge Condition: Good  CODE STATUS: FULL Diet recommendation: Regular  Brief/Interim Summary: Laura Montgomery is a 43 y.o. F with allergic rhinitis who presents with palpitations.  Patient exposed to a SARS-CoV-2+ patient a week ago, tested positive on 06/23/18.  Since then, in isolation at home.  On day of admission, started to develop palpitations, went to UC, where she had sinus tachycardia and was sent to ER.  In ER, ECG showed sinus tachycardia, PR depressions.  CXR clear.  CTA showed no PE, normal cardiac size, minimal posterior right pneumonia.  She was stable on room air, but had tachycardia to 140s with ambulation, which did not improve with IV fluids.        PRINCIPAL HOSPITAL DIAGNOSIS: Sinus tachycardia    Discharge Diagnoses:   Sinus tachycardia This was probably multifactorial from resolving COVID, possibly dehydration, possibly deconditioning, possibly pain and or deconditioning.  TSH normal.  PE ruled out.  UDS negative.  Electrolytes normal.  Telemetry overnight unremarkable.  Minimal anemia.  Troponin normal and CT shows normal heart size, Cardiology reviewed case and felt cardiomyopathy is unlikely, recommended outpatient echocardiogram if still tachycardic.      Anemia Follow up with PCP to have iron checked, repeat Hgb.         Discharge Instructions  Discharge Instructions    Diet general   Complete by: As directed    Discharge instructions   Complete  by: As directed    From Dr. Loleta Books: You were admitted with palpitations. We ruled out that this was from a blood clot in your lungs, from thyroid disease, or from dehydration.  You had no signs of heart muscle injury, and your heart appeared normal on CAT scan of the chest.  Your COVID pneumonia was very small, and I suspect not related to the fast heart rate.  Thankfully, your heart rate has resolved.  You should follow up with a primary care doctor in 1-2 weeks, if able. If you are still having palpitations, they can refer you to a cardiologist at that time, if needed.   With regard to COVID: it appears to me that, now that you have tested positive again, you are compelled to continue your self-isolation until you test negative.  HOWEVER, you have no personal medical conditions for which you need special self-isolation guidelines, and the [facility] Department is the only authority to determine when you may safely end self-isolation and return to work.   In the next one week, it would be reasonable to obtain a pulse oximeter (this is a device available over the counter at any pharmacy to measure blood oxygen levels) and check your oxygen level from time to time.  Levels 96-100% are normal but anything 88% or above is acceptable. If you have new trouble breathing or if your oxygen level is persistently 87% or below, you should return to be evaluated for care.    The websites I mentioned are:  St[employer]lyYoga.com.cy Https://www.nature.com/nm/ (look for the weekly "COVID-19 research briefs")  Increase activity slowly   Complete by: As directed    MyChart COVID-19 home monitoring program   Complete by: Jul 04, 2018    Is the patient willing to use the [place] for home monitoring?: Yes   Temperature monitoring   Complete by: Jul 04, 2018    After how many days would you like to receive a notification of this patient's flowsheet entries?: 1      Allergies as of 07/04/2018   No Known Allergies     Medication List    STOP taking these medications   amoxicillin-clavulanate 875-125 MG tablet Commonly known as: AUGMENTIN   nortriptyline 10 MG capsule Commonly known as: PAMELOR     TAKE these medications   Dymista 137-50 MCG/ACT Susp Generic drug: Azelastine-Fluticasone Place 1 spray into the nose 2 (two) times daily as needed (allergies).   EpiPen 2-Pak 0.3 mg/0.3 mL Soaj injection Generic drug: EPINEPHrine Inject 0.3 mg into the muscle daily as needed for anaphylaxis.   montelukast 10 MG tablet Commonly known as: SINGULAIR Take 10 mg by mouth at bedtime.   multivitamin with minerals Tabs tablet Take 1 tablet by mouth daily.   Pataday 0.1 % ophthalmic solution Generic drug: olopatadine Place 1 drop into both eyes 2 (two) times daily as needed for allergies.       No Known Allergies  Consultations:  Cardiology by phone   Procedures/Studies: Ct Angio Chest Pe W And/or Wo Contrast  Result Date: 07/03/2018 CLINICAL DATA:  Shortness of breath EXAM: CT ANGIOGRAPHY CHEST WITH CONTRAST TECHNIQUE: Multidetector CT imaging of the chest was performed using the standard protocol during bolus administration of intravenous contrast. Multiplanar CT image reconstructions and MIPs were obtained to evaluate the vascular anatomy. CONTRAST:  45mL OMNIPAQUE IOHEXOL 350 MG/ML SOLN COMPARISON:  Chest x-ray 07/03/2018 FINDINGS: Cardiovascular: Satisfactory opacification of the pulmonary arteries to the segmental level. No evidence of pulmonary embolism. Normal heart size. No pericardial effusion. Mediastinum/Nodes: No enlarged mediastinal, hilar, or axillary lymph nodes. Thyroid gland, trachea, and esophagus demonstrate no significant findings. Lungs/Pleura: Small foci of ground-glass density in the subpleural right lower lobe. No pleural effusion. No pneumothorax. Upper Abdomen: No acute abnormality. Musculoskeletal: No chest wall  abnormality. No acute or significant osseous findings. Review of the MIP images confirms the above findings. IMPRESSION: 1. Negative for acute pulmonary embolus. 2. Minimal patchy ground-glass subpleural density in the right lower lobe posteriorly, may reflect small foci pneumonia. Electronically Signed   By: Donavan Foil M.D.   On: 07/03/2018 23:24   Dg Chest Port 1 View  Result Date: 07/03/2018 CLINICAL DATA:  Chest pain EXAM: PORTABLE CHEST 1 VIEW COMPARISON:  07/22/2012 FINDINGS: The heart size and mediastinal contours are within normal limits. Both lungs are clear. The visualized skeletal structures are unremarkable. IMPRESSION: No active disease. Electronically Signed   By: Donavan Foil M.D.   On: 07/03/2018 19:46      Subjective: No chest pain, no further palpitations.  No confusion, no fever, no sputum production.  Discharge Exam: Vitals:   07/04/18 0824 07/04/18 0825  BP:  131/87  Pulse:  98  Resp:  15  Temp: 97.8 F (36.6 C)   SpO2:  100%   Vitals:   07/04/18 0618 07/04/18 0627 07/04/18 0824 07/04/18 0825  BP:    131/87  Pulse:    98  Resp:    15  Temp:  98.6 F (37 C) 97.8 F (36.6 C)   TempSrc:  Oral Oral  SpO2:    100%  Weight: 67.4 kg     Height: 5\' 10"  (1.778 m)       General: Pt is alert, awake, not in acute distress Cardiovascular: RRR, nl S1-S2, no murmurs appreciated.   No LE edema.   Respiratory: Normal respiratory rate and rhythm.  CTAB without rales or wheezes. Abdominal: Abdomen soft and non-tender.  No distension or HSM.   Neuro/Psych: Strength symmetric in upper and lower extremities.  Judgment and insight appear normal.   The results of significant diagnostics from this hospitalization (including imaging, microbiology, ancillary and laboratory) are listed below for reference.     Microbiology: Recent Results (from the past 240 hour(s))  SARS Coronavirus 2     Status: Abnormal   Collection Time: 07/04/18 12:35 AM  Result Value Ref Range  Status   SARS Coronavirus 2 DETECTED (A) NOT DETECTED Final    Comment: RESULT CALLED TO, READ BACK BY AND VERIFIED WITH: S. CRUZ,RN 0223 07/04/2018 T. TYSOR (NOTE) SARS-CoV-2 target nucleic acids are DETECTED. The SARS-CoV-2 RNA is generally detectable in upper and lower respiratory specimens during the acute phase of infection. Positive results are indicative of active infection with SARS-CoV-2. Clinical  correlation with patient history and other diagnostic information is necessary to determine patient infection status. Positive results do  not rule out bacterial infection or co-infection with other viruses. The expected result is Not Detected. Fact Sheet for Patients: http://www.biofiredefense.com/wp-content/uploads/2020/03/BIOFIRE-COVID -19-patients.pdf Fact Sheet for Healthcare Providers: http://www.biofiredefense.com/wp-content/uploads/2020/03/BIOFIRE-COVID -19-hcp.pdf This test is not yet approved or cleared by the [place] and  has been authorized for detection and/or diagnosis of SARS-CoV-2 by FDA under an Emergency [place] e Authorization (EUA).  This EUA will remain in effect (meaning this test can be used) for the duration of  the COVID-19 declaration under Section 564(b)(1) of the Act, 21 U.S.C. section 6826355182 3(b)(1), unless the authorization is terminated or revoked sooner. Performed at [facility], [place] [address] [place], [place] [address]      Labs: BNP (last 3 results) No results for input(s): BNP in the last 8760 hours. Basic Metabolic Panel: Recent Labs  Lab 07/03/18 1915 07/04/18 0908  NA 135 138  K 3.6 3.7  CL 101 102  CO2 24 25  GLUCOSE 105* 145*  BUN 9 8  CREATININE 0.73 0.68  CALCIUM 9.5 9.2  MG 1.9  --    Liver Function Tests: Recent Labs  Lab 07/04/18 0908  AST 19  ALT 11  ALKPHOS 42  BILITOT 0.5  PROT 7.7  ALBUMIN 4.1   No results for input(s): LIPASE, AMYLASE in the last 168 hours. No results for input(s): AMMONIA  in the last 168 hours. CBC: Recent Labs  Lab 07/03/18 1915 07/04/18 0908  WBC 4.2 6.0  NEUTROABS 2.5  --   HGB 11.1* 10.7*  HCT 33.8* 32.3*  MCV 95.2 96.7  PLT 320 270   Cardiac Enzymes: Recent Labs  Lab 07/03/18 1915  TROPONINI <0.03   BNP: Invalid input(s): POCBNP CBG: No results for input(s): GLUCAP in the last 168 hours. D-Dimer Recent Labs    07/03/18 1916  DDIMER 0.40   Hgb A1c No results for input(s): HGBA1C in the last 72 hours. Lipid Profile No results for input(s): CHOL, HDL, LDLCALC, TRIG, CHOLHDL, LDLDIRECT in the last 72 hours. Thyroid function studies Recent Labs    07/03/18 1919  TSH 3.461   Anemia work up No results for input(s): VITAMINB12, FOLATE, FERRITIN, TIBC, IRON, RETICCTPCT in the  last 72 hours. Urinalysis    Component Value Date/Time   BILIRUBINUR small 03/29/2014 1513   PROTEINUR 100 03/29/2014 1513   UROBILINOGEN 4.0 03/29/2014 1513   NITRITE positive 03/29/2014 1513   LEUKOCYTESUR large (3+) 03/29/2014 1513   Sepsis Labs Invalid input(s): PROCALCITONIN,  WBC,  LACTICIDVEN Microbiology Recent Results (from the past 240 hour(s))  SARS Coronavirus 2     Status: Abnormal   Collection Time: 07/04/18 12:35 AM  Result Value Ref Range Status   SARS Coronavirus 2 DETECTED (A) NOT DETECTED Final    Comment: RESULT CALLED TO, READ BACK BY AND VERIFIED WITH: S. CRUZ,RN 0223 07/04/2018 T. TYSOR (NOTE) SARS-CoV-2 target nucleic acids are DETECTED. The SARS-CoV-2 RNA is generally detectable in upper and lower respiratory specimens during the acute phase of infection. Positive results are indicative of active infection with SARS-CoV-2. Clinical  correlation with patient history and other diagnostic information is necessary to determine patient infection status. Positive results do  not rule out bacterial infection or co-infection with other viruses. The expected result is Not Detected. Fact Sheet for  Patients: http://www.biofiredefense.com/wp-content/uploads/2020/03/BIOFIRE-COVID -19-patients.pdf Fact Sheet for Healthcare Providers: http://www.biofiredefense.com/wp-content/uploads/2020/03/BIOFIRE-COVID -19-hcp.pdf This test is not yet approved or cleared by the [place] and  has been authorized for detection and/or diagnosis of SARS-CoV-2 by FDA under an Emergency [place] e Authorization (EUA).  This EUA will remain in effect (meaning this test can be used) for the duration of  the COVID-19 declaration under Section 564(b)(1) of the Act, 21 U.S.C. section 6233627310 3(b)(1), unless the authorization is terminated or revoked sooner. Performed at [facility], [place] [address], [place], [place] [address]      Time coordinating discharge: 25 minutes      SIGNED:   Edwin Dada, MD  [facility] 07/04/2018, 6:23 PM
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
## 2018-07-04 NOTE — ED Notes (Signed)
[facility] ADVISED IT WILL BE AFTER SHIFT CHANGE AT 8 AM BEFORE THEY CAN COME GET PATIENT--Laura Montgomery

## 2018-07-04 NOTE — ED Notes (Signed)
Attempted to give report 

## 2018-07-05 ENCOUNTER — Encounter (INDEPENDENT_AMBULATORY_CARE_PROVIDER_SITE_OTHER): Payer: Self-pay

## 2018-07-05 LAB — HIV ANTIBODY (ROUTINE TESTING W REFLEX): HIV Screen 4th Generation wRfx: NONREACTIVE

## 2018-07-06 ENCOUNTER — Encounter (INDEPENDENT_AMBULATORY_CARE_PROVIDER_SITE_OTHER): Payer: Self-pay

## 2018-07-07 ENCOUNTER — Encounter (HOSPITAL_COMMUNITY): Payer: Self-pay | Admitting: Emergency Medicine

## 2018-07-07 ENCOUNTER — Emergency Department (HOSPITAL_COMMUNITY)
Admission: EM | Admit: 2018-07-07 | Discharge: 2018-07-07 | Disposition: A | Discharge status: Home / Self Care | Payer: BC Managed Care – PPO | Attending: Emergency Medicine | Admitting: Emergency Medicine

## 2018-07-07 ENCOUNTER — Emergency Department (HOSPITAL_COMMUNITY): Payer: BC Managed Care – PPO

## 2018-07-07 ENCOUNTER — Telehealth: Payer: Self-pay | Admitting: *Deleted

## 2018-07-07 ENCOUNTER — Other Ambulatory Visit: Payer: Self-pay

## 2018-07-07 ENCOUNTER — Encounter (INDEPENDENT_AMBULATORY_CARE_PROVIDER_SITE_OTHER): Payer: Self-pay

## 2018-07-07 DIAGNOSIS — R0602 Shortness of breath: Secondary | ICD-10-CM

## 2018-07-07 DIAGNOSIS — R002 Palpitations: Secondary | ICD-10-CM | POA: Diagnosis not present

## 2018-07-07 DIAGNOSIS — F419 Anxiety disorder, unspecified: Secondary | ICD-10-CM | POA: Diagnosis not present

## 2018-07-07 DIAGNOSIS — Z79899 Other long term (current) drug therapy: Secondary | ICD-10-CM | POA: Insufficient documentation

## 2018-07-07 DIAGNOSIS — R05 Cough: Secondary | ICD-10-CM | POA: Diagnosis not present

## 2018-07-07 DIAGNOSIS — R079 Chest pain, unspecified: Secondary | ICD-10-CM | POA: Diagnosis not present

## 2018-07-07 DIAGNOSIS — R12 Heartburn: Secondary | ICD-10-CM | POA: Diagnosis not present

## 2018-07-07 DIAGNOSIS — R0789 Other chest pain: Secondary | ICD-10-CM | POA: Diagnosis not present

## 2018-07-07 LAB — BASIC METABOLIC PANEL
Anion gap: 12 (ref 5–15)
BUN: 9 mg/dL (ref 6–20)
CO2: 23 mmol/L (ref 22–32)
Calcium: 9.5 mg/dL (ref 8.9–10.3)
Chloride: 100 mmol/L (ref 98–111)
Creatinine, Ser: 0.95 mg/dL (ref 0.44–1.00)
GFR calc Af Amer: >60 mL/min (ref >60)
GFR calc non Af Amer: >60 mL/min (ref >60)
Glucose, Bld: 126 mg/dL — ABNORMAL HIGH (ref 70–99)
Potassium: 3.3 mmol/L — ABNORMAL LOW (ref 3.5–5.1)
Sodium: 135 mmol/L (ref 135–145)

## 2018-07-07 LAB — CBC
HCT: 32.8 % — ABNORMAL LOW (ref 36.0–46.0)
Hemoglobin: 10.8 g/dL — ABNORMAL LOW (ref 12.0–15.0)
MCH: 31.1 pg (ref 26.0–34.0)
MCHC: 32.9 g/dL (ref 30.0–36.0)
MCV: 94.5 fL (ref 80.0–100.0)
Platelets: 349 10*3/uL (ref 150–400)
RBC: 3.47 MIL/uL — ABNORMAL LOW (ref 3.87–5.11)
RDW: 12.6 % (ref 11.5–15.5)
WBC: 4.3 10*3/uL (ref 4.0–10.5)
nRBC: 0 % (ref 0.0–0.2)

## 2018-07-07 LAB — TROPONIN I: Troponin I: <0.03 ng/mL (ref <0.03)

## 2018-07-07 LAB — I-STAT BETA HCG BLOOD, ED (MC, WL, AP ONLY): I-stat hCG, quantitative: <5 m[IU]/mL (ref <5)

## 2018-07-07 LAB — TSH: TSH: 3.766 u[IU]/mL (ref 0.350–4.500)

## 2018-07-07 MED ORDER — SODIUM CHLORIDE 0.9% FLUSH: 3.0000 mL | Freq: Once | INTRAVENOUS | Status: DC

## 2018-07-07 MED ORDER — LACTATED RINGERS IV BOLUS
1000.0000 mL | Freq: Once | INTRAVENOUS | Status: AC
  Administered 2018-07-07: 1000 mL via INTRAVENOUS

## 2018-07-07 MED ORDER — LORAZEPAM 2 MG/ML IJ SOLN
1.0000 mg | Freq: Once | INTRAMUSCULAR | Status: AC
  Administered 2018-07-07: 1 mg via INTRAVENOUS
  Filled 2018-07-07: qty 1

## 2018-07-07 MED ORDER — POTASSIUM CHLORIDE 20 MEQ PO PACK: 40.0000 meq | PACK | Freq: Once | ORAL | Status: DC

## 2018-07-07 MED ORDER — POTASSIUM CHLORIDE CRYS ER 20 MEQ PO TBCR
80.0000 meq | EXTENDED_RELEASE_TABLET | Freq: Once | ORAL | Status: AC
  Administered 2018-07-07: 80 meq via ORAL
  Filled 2018-07-07: qty 4

## 2018-07-07 MED ORDER — POTASSIUM CHLORIDE 20 MEQ PO PACK
80.0000 meq | PACK | Freq: Once | ORAL | Status: DC
  Filled 2018-07-07: qty 4

## 2018-07-07 MED ORDER — MAGNESIUM SULFATE IN D5W 1-5 GM/100ML-% IV SOLN
1.0000 g | Freq: Once | INTRAVENOUS | Status: AC
  Administered 2018-07-07: 1 g via INTRAVENOUS
  Filled 2018-07-07: qty 100

## 2018-07-07 MED ORDER — ACETAMINOPHEN 500 MG PO TABS
1000.0000 mg | ORAL_TABLET | Freq: Once | ORAL | Status: AC
  Administered 2018-07-07: 1000 mg via ORAL
  Filled 2018-07-07: qty 2
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
## 2018-07-07 NOTE — Discharge Instructions (Signed)

## 2018-07-07 NOTE — Telephone Encounter (Signed)
Contacted pt due to my chart companion response of worsening appetite on 07/07/2018; she says that she ate crackers because some diarrhea on Saturday; she also had a loose stool today; the pt says that she can eat however she is afraid to eat; the pt says that she is having chest pain that radiates to her shoulder; she was previousl given Pepcid at the urgent care due to chest pain and burning; the pt says that she is thinking about going to the ED because she was previously advised to do this if her chest pain started to radiate; pt advised to proceed to the ED; she verbalized understanding.

## 2018-07-07 NOTE — ED Notes (Signed)
Patient verbalizes understanding of discharge instructions . Opportunity for questions and answers were provided . Armband removed by staff ,Pt discharged from ED. W/C  offered at D/C  and Declined W/C at D/C and was escorted to lobby by RN.  

## 2018-07-07 NOTE — ED Triage Notes (Addendum)
Patient reports diagnosed with covid on 6/10, had two more recent tests that were pos however she was told by the health dept she is in the recovery phase. Has only mild cough in the am but reports some burning pain in her chest that goes into her shoulders. Patient with HR in the 140s which she has had previous work up for since being diagnosed with covid

## 2018-07-08 ENCOUNTER — Encounter (INDEPENDENT_AMBULATORY_CARE_PROVIDER_SITE_OTHER): Payer: Self-pay

## 2018-07-09 ENCOUNTER — Encounter: Payer: Self-pay | Admitting: Family Medicine

## 2018-07-09 ENCOUNTER — Encounter (INDEPENDENT_AMBULATORY_CARE_PROVIDER_SITE_OTHER): Payer: Self-pay

## 2018-07-09 ENCOUNTER — Other Ambulatory Visit: Payer: Self-pay

## 2018-07-09 ENCOUNTER — Telehealth (INDEPENDENT_AMBULATORY_CARE_PROVIDER_SITE_OTHER): Payer: BC Managed Care – PPO | Admitting: Family Medicine

## 2018-07-09 VITALS — Temp 98.4°F | Ht 70.0 in | Wt 148.0 lb

## 2018-07-09 DIAGNOSIS — R000 Tachycardia, unspecified: Secondary | ICD-10-CM | POA: Diagnosis not present

## 2018-07-09 LAB — CULTURE, BLOOD (ROUTINE X 2)
Culture: NO GROWTH
Special Requests: ADEQUATE

## 2018-07-09 MED ORDER — CITALOPRAM HYDROBROMIDE 10 MG PO TABS: 10.0000 mg | ORAL_TABLET | Freq: Every day | ORAL | 1 refills | Status: DC

## 2018-07-09 NOTE — Progress Notes (Signed)
Chief Complaint: Hospital f/u  Pt states that she still is having some burning in the center of her chest that travels to her upper chest shoulder and feel anxious. Pt was tested for COVID on June 10 and results came back Positive on June 19
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
## 2018-07-10 ENCOUNTER — Encounter (INDEPENDENT_AMBULATORY_CARE_PROVIDER_SITE_OTHER): Payer: Self-pay

## 2018-07-11 ENCOUNTER — Encounter (INDEPENDENT_AMBULATORY_CARE_PROVIDER_SITE_OTHER): Payer: Self-pay

## 2018-07-12 ENCOUNTER — Encounter (INDEPENDENT_AMBULATORY_CARE_PROVIDER_SITE_OTHER): Payer: Self-pay

## 2018-07-12 ENCOUNTER — Telehealth: Payer: BC Managed Care – PPO | Admitting: Family

## 2018-07-12 DIAGNOSIS — R12 Heartburn: Secondary | ICD-10-CM

## 2018-07-12 MED ORDER — PANTOPRAZOLE SODIUM 40 MG PO TBEC: 40.0000 mg | DELAYED_RELEASE_TABLET | Freq: Every day | ORAL | 0 refills | Status: DC

## 2018-07-12 NOTE — Progress Notes (Signed)
We are sorry that you are not feeling well.  Here is how we plan to help!  Based on what you shared with me it looks like you most likely have Gastroesophageal Reflux Disease (GERD)  Gastroesophageal reflux disease (GERD) happens when acid from your stomach flows up into the esophagus.  When acid comes in contact with the esophagus, the acid causes sorenss (inflammation) in the esophagus.  Over time, GERD may create small holes (ulcers) in the lining of the esophagus.  I have prescribe Protonix to help with your symptoms.   Your symptoms should improve in the next day or two.  You can use antacids as needed until symptoms resolve.  Call us if your heartburn worsens, you have trouble swallowing, weight loss, spitting up blood or recurrent vomiting.  Home Care:  May include lifestyle changes such as weight loss, quitting smoking and alcohol consumption  Avoid foods and drinks that make your symptoms worse, such as:  Caffeine or alcoholic drinks  Chocolate  Peppermint or mint flavorings  Garlic and onions  Spicy foods  Citrus fruits, such as oranges, lemons, or limes  Tomato-based foods such as sauce, chili, salsa and pizza  Fried and fatty foods  Avoid lying down for 3 hours prior to your bedtime or prior to taking a nap  Eat small, frequent meals instead of a large meals  Wear loose-fitting clothing.  Do not wear anything tight around your waist that causes pressure on your stomach.  Raise the head of your bed 6 to 8 inches with wood blocks to help you sleep.  Extra pillows will not help.  Seek Help Right Away If:  You have pain in your arms, neck, jaw, teeth or back  Your pain increases or changes in intensity or duration  You develop nausea, vomiting or sweating (diaphoresis)  You develop shortness of breath or you faint  Your vomit is green, yellow, black or looks like coffee grounds or blood  Your stool is red, bloody or black  These symptoms could be signs of  other problems, such as heart disease, gastric bleeding or esophageal bleeding.  Make sure you :  Understand these instructions.  Will watch your condition.  Will get help right away if you are not doing well or get worse.  Your e-visit answers were reviewed by a board certified advanced clinical practitioner to complete your personal care plan.  Depending on the condition, your plan could have included both over the counter or prescription medications.  If there is a problem please reply  once you have received a response from your provider.  Your safety is important to [place].  If you have drug allergies check your prescription carefully.    You can use MyChart to ask questions about today's visit, request a non-urgent call back, or ask for a work or school excuse for 24 hours related to this e-Visit. If it has been greater than 24 hours you will need to follow up with your provider, or enter a new e-Visit to address those concerns.  You will get an e-mail in the next two days asking about your experience.  I hope that your e-visit has been valuable and will speed your recovery. Thank you for using e-visits.  Greater than 5 minutes, yet less than 10 minutes of time have been spent researching, coordinating, and implementing care for this patient today.  Thank you for the details you included in the comment boxes. Those details are very helpful in determining the  best course of treatment for you and help [place] to provide the best care.

## 2018-07-13 ENCOUNTER — Encounter (INDEPENDENT_AMBULATORY_CARE_PROVIDER_SITE_OTHER): Payer: Self-pay

## 2018-07-14 ENCOUNTER — Encounter (INDEPENDENT_AMBULATORY_CARE_PROVIDER_SITE_OTHER): Payer: Self-pay

## 2018-07-15 ENCOUNTER — Emergency Department (HOSPITAL_COMMUNITY)
Admission: EM | Admit: 2018-07-15 | Discharge: 2018-07-15 | Disposition: A | Discharge status: Home / Self Care | Payer: BC Managed Care – PPO | Attending: Emergency Medicine | Admitting: Emergency Medicine

## 2018-07-15 ENCOUNTER — Encounter (INDEPENDENT_AMBULATORY_CARE_PROVIDER_SITE_OTHER): Payer: Self-pay

## 2018-07-15 ENCOUNTER — Other Ambulatory Visit: Payer: Self-pay

## 2018-07-15 DIAGNOSIS — U071 COVID-19: Secondary | ICD-10-CM | POA: Diagnosis not present

## 2018-07-15 DIAGNOSIS — E876 Hypokalemia: Secondary | ICD-10-CM | POA: Diagnosis not present

## 2018-07-15 DIAGNOSIS — R002 Palpitations: Secondary | ICD-10-CM | POA: Insufficient documentation

## 2018-07-15 DIAGNOSIS — Z79899 Other long term (current) drug therapy: Secondary | ICD-10-CM | POA: Diagnosis not present

## 2018-07-15 LAB — CBC WITH DIFFERENTIAL/PLATELET
Abs Immature Granulocytes: 0.01 10*3/uL (ref 0.00–0.07)
Basophils Absolute: 0 10*3/uL (ref 0.0–0.1)
Basophils Relative: 1 %
Eosinophils Absolute: 0 10*3/uL (ref 0.0–0.5)
Eosinophils Relative: 0 %
HCT: 34 % — ABNORMAL LOW (ref 36.0–46.0)
Hemoglobin: 11.2 g/dL — ABNORMAL LOW (ref 12.0–15.0)
Immature Granulocytes: 0 %
Lymphocytes Relative: 23 %
Lymphs Abs: 0.9 10*3/uL (ref 0.7–4.0)
MCH: 31.4 pg (ref 26.0–34.0)
MCHC: 32.9 g/dL (ref 30.0–36.0)
MCV: 95.2 fL (ref 80.0–100.0)
Monocytes Absolute: 0.5 10*3/uL (ref 0.1–1.0)
Monocytes Relative: 14 %
Neutro Abs: 2.4 10*3/uL (ref 1.7–7.7)
Neutrophils Relative %: 62 %
Platelets: 320 10*3/uL (ref 150–400)
RBC: 3.57 MIL/uL — ABNORMAL LOW (ref 3.87–5.11)
RDW: 12.6 % (ref 11.5–15.5)
WBC: 3.8 10*3/uL — ABNORMAL LOW (ref 4.0–10.5)
nRBC: 0 % (ref 0.0–0.2)

## 2018-07-15 LAB — BASIC METABOLIC PANEL
Anion gap: 13 (ref 5–15)
BUN: 8 mg/dL (ref 6–20)
CO2: 25 mmol/L (ref 22–32)
Calcium: 9.5 mg/dL (ref 8.9–10.3)
Chloride: 98 mmol/L (ref 98–111)
Creatinine, Ser: 0.87 mg/dL (ref 0.44–1.00)
GFR calc Af Amer: >60 mL/min (ref >60)
GFR calc non Af Amer: >60 mL/min (ref >60)
Glucose, Bld: 99 mg/dL (ref 70–99)
Potassium: 3.1 mmol/L — ABNORMAL LOW (ref 3.5–5.1)
Sodium: 136 mmol/L (ref 135–145)

## 2018-07-15 MED ORDER — HYDROXYZINE HCL 25 MG PO TABS
25.0000 mg | ORAL_TABLET | Freq: Once | ORAL | Status: AC
  Administered 2018-07-15: 25 mg via ORAL
  Filled 2018-07-15: qty 1

## 2018-07-15 MED ORDER — POTASSIUM CHLORIDE CRYS ER 20 MEQ PO TBCR
40.0000 meq | EXTENDED_RELEASE_TABLET | Freq: Once | ORAL | Status: AC
  Administered 2018-07-15: 40 meq via ORAL
  Filled 2018-07-15: qty 2

## 2018-07-15 MED ORDER — HYDROXYZINE HCL 25 MG PO TABS: 25.0000 mg | ORAL_TABLET | Freq: Three times a day (TID) | ORAL | 0 refills | Status: AC | PRN
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
## 2018-07-15 NOTE — ED Triage Notes (Addendum)
Patient reports that her "heart is racing." She was recently diagnosed with tachycardia and GERD. She denies chest pain, SOB, N/V/D. Patient states that any type of movement seems to make her heart rate increase.

## 2018-07-15 NOTE — Discharge Instructions (Addendum)
Continue taking your medications as prescribed.  You can take hydroxyzine as needed for anxiety/palpitations.  Be aware this medication may cause some drowsiness so do not drive, drink alcohol, or operate heavy machinery if you are going to take this medication and it makes you drowsy.  Follow-up with your primary care provider or cardiologist for reevaluation of your palpitations.  They may want to do Holter monitoring or an echocardiogram (ultrasound) of the heart.  Your primary care provider may be able to order these tests so that they can be done in advance of your upcoming follow-up visit.  Return to the emergency department if any concerning signs or symptoms develop such as heart rate greater than 130 bpm, passing out, high fevers, chest pain, shortness of breath.

## 2018-07-16 ENCOUNTER — Encounter (INDEPENDENT_AMBULATORY_CARE_PROVIDER_SITE_OTHER): Payer: Self-pay

## 2018-07-17 ENCOUNTER — Encounter (INDEPENDENT_AMBULATORY_CARE_PROVIDER_SITE_OTHER): Payer: Self-pay

## 2018-07-21 DIAGNOSIS — R9431 Abnormal electrocardiogram [ECG] [EKG]: Secondary | ICD-10-CM | POA: Diagnosis not present

## 2018-07-21 DIAGNOSIS — U071 COVID-19: Secondary | ICD-10-CM | POA: Diagnosis not present

## 2018-07-21 DIAGNOSIS — R000 Tachycardia, unspecified: Secondary | ICD-10-CM | POA: Diagnosis not present

## 2018-07-22 DIAGNOSIS — R002 Palpitations: Secondary | ICD-10-CM | POA: Diagnosis not present

## 2018-07-22 NOTE — Telephone Encounter (Signed)
This encounter was created in error - please disregard.

## 2018-07-24 ENCOUNTER — Telehealth: Payer: Self-pay | Admitting: Family Medicine

## 2018-07-24 NOTE — Telephone Encounter (Signed)
Patient stated she was prescribed citalopram (CELEXA) 10 MG tablet by Benny Lennert but this medication didn't work and the patient had to go to Urgent Care and she was prescribed Hydroxyzine HCL 25MG , every 8hrs as needed.  This medication is working.  So she would like for Benny Lennert to write a prescription for the Hydroxyzine, and send it to her preferred pharmacy [facility] on [address].

## 2018-07-29 ENCOUNTER — Telehealth: Payer: Self-pay

## 2018-07-29 DIAGNOSIS — R7309 Other abnormal glucose: Secondary | ICD-10-CM | POA: Diagnosis not present

## 2018-07-29 DIAGNOSIS — R002 Palpitations: Secondary | ICD-10-CM | POA: Diagnosis not present

## 2018-07-29 DIAGNOSIS — Z131 Encounter for screening for diabetes mellitus: Secondary | ICD-10-CM | POA: Diagnosis not present

## 2018-07-29 DIAGNOSIS — K219 Gastro-esophageal reflux disease without esophagitis: Secondary | ICD-10-CM | POA: Diagnosis not present

## 2018-07-29 DIAGNOSIS — I319 Disease of pericardium, unspecified: Secondary | ICD-10-CM | POA: Diagnosis not present

## 2018-07-29 DIAGNOSIS — R5383 Other fatigue: Secondary | ICD-10-CM | POA: Diagnosis not present

## 2018-07-29 DIAGNOSIS — Z79899 Other long term (current) drug therapy: Secondary | ICD-10-CM | POA: Diagnosis not present

## 2018-07-29 DIAGNOSIS — D509 Iron deficiency anemia, unspecified: Secondary | ICD-10-CM | POA: Diagnosis not present

## 2018-07-29 DIAGNOSIS — Z833 Family history of diabetes mellitus: Secondary | ICD-10-CM | POA: Diagnosis not present

## 2018-07-29 NOTE — Telephone Encounter (Signed)
Phone screening complete 

## 2018-07-30 ENCOUNTER — Ambulatory Visit (INDEPENDENT_AMBULATORY_CARE_PROVIDER_SITE_OTHER): Payer: BC Managed Care – PPO | Admitting: Internal Medicine

## 2018-07-30 ENCOUNTER — Encounter: Payer: Self-pay | Admitting: Internal Medicine

## 2018-07-30 VITALS — Ht 70.0 in | Wt 139.0 lb

## 2018-07-30 DIAGNOSIS — R0989 Other specified symptoms and signs involving the circulatory and respiratory systems: Secondary | ICD-10-CM

## 2018-07-30 DIAGNOSIS — R12 Heartburn: Secondary | ICD-10-CM

## 2018-07-30 DIAGNOSIS — K219 Gastro-esophageal reflux disease without esophagitis: Secondary | ICD-10-CM | POA: Diagnosis not present

## 2018-07-30 DIAGNOSIS — R198 Other specified symptoms and signs involving the digestive system and abdomen: Secondary | ICD-10-CM
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
## 2018-07-31 NOTE — Patient Instructions (Signed)
1.  Reflux precautions  2.  Continue 1 month course of pantoprazole then stop.  For recurrent symptoms may resume medication  3.  Any questions problems or issues (GI issues) please contact this office.  Otherwise resume care with her primary provider.

## 2018-08-02 NOTE — Telephone Encounter (Signed)
Dr. Holly Bodily pt requesting refill for hydroxyzine 25 mg be sent to [facility] [address], original script written by Dr. Nils Flack.  Pt given #12 with no refill.  Is it ok to fill?  See see 07/24/2018 telephone note. Dgaddy, CMA

## 2018-08-05 NOTE — Telephone Encounter (Signed)
Pt to schedule to discuss new medication

## 2018-08-06 NOTE — Telephone Encounter (Signed)
LVM to return call.

## 2018-08-08 ENCOUNTER — Telehealth: Payer: Self-pay | Admitting: Internal Medicine

## 2018-08-08 NOTE — Telephone Encounter (Signed)
Patient reports he has a new burning sensation in her mouth, not a sore throat. Discussed with her this is something she should address with her PCP. Pt will check with her PCP.

## 2018-08-11 ENCOUNTER — Telehealth: Payer: Self-pay | Admitting: *Deleted

## 2018-08-11 NOTE — Telephone Encounter (Signed)
Spoke to patient about the medication Hydroxyzine 25 mg she requested for refill. The patient states she has a PCP at another practice that is following her and the medication was refilled.

## 2018-08-12 ENCOUNTER — Ambulatory Visit: Payer: BC Managed Care – PPO | Admitting: Cardiology

## 2018-08-13 DIAGNOSIS — J343 Hypertrophy of nasal turbinates: Secondary | ICD-10-CM | POA: Diagnosis not present

## 2018-08-13 DIAGNOSIS — J310 Chronic rhinitis: Secondary | ICD-10-CM | POA: Diagnosis not present

## 2018-08-14 DIAGNOSIS — U071 COVID-19: Secondary | ICD-10-CM | POA: Diagnosis not present

## 2018-08-14 DIAGNOSIS — R002 Palpitations: Secondary | ICD-10-CM | POA: Diagnosis not present

## 2018-08-15 DIAGNOSIS — R002 Palpitations: Secondary | ICD-10-CM | POA: Diagnosis not present

## 2018-08-16 ENCOUNTER — Other Ambulatory Visit: Payer: Self-pay | Admitting: Family

## 2018-08-18 ENCOUNTER — Other Ambulatory Visit: Payer: Self-pay

## 2018-08-18 MED ORDER — PANTOPRAZOLE SODIUM 40 MG PO TBEC: 40.0000 mg | DELAYED_RELEASE_TABLET | Freq: Every day | ORAL | 11 refills | Status: DC

## 2018-08-25 DIAGNOSIS — R079 Chest pain, unspecified: Secondary | ICD-10-CM | POA: Diagnosis not present

## 2018-08-25 DIAGNOSIS — R071 Chest pain on breathing: Secondary | ICD-10-CM | POA: Diagnosis not present

## 2018-08-26 DIAGNOSIS — R7989 Other specified abnormal findings of blood chemistry: Secondary | ICD-10-CM | POA: Diagnosis not present

## 2018-08-26 DIAGNOSIS — U071 COVID-19: Secondary | ICD-10-CM | POA: Diagnosis not present

## 2018-09-02 DIAGNOSIS — L819 Disorder of pigmentation, unspecified: Secondary | ICD-10-CM | POA: Diagnosis not present

## 2018-09-02 DIAGNOSIS — D2272 Melanocytic nevi of left lower limb, including hip: Secondary | ICD-10-CM | POA: Diagnosis not present

## 2018-09-02 DIAGNOSIS — L603 Nail dystrophy: Secondary | ICD-10-CM | POA: Diagnosis not present

## 2018-09-02 DIAGNOSIS — D2262 Melanocytic nevi of left upper limb, including shoulder: Secondary | ICD-10-CM | POA: Diagnosis not present

## 2018-09-04 DIAGNOSIS — H5213 Myopia, bilateral: Secondary | ICD-10-CM | POA: Diagnosis not present

## 2018-09-24 DIAGNOSIS — J310 Chronic rhinitis: Secondary | ICD-10-CM | POA: Diagnosis not present

## 2018-09-24 DIAGNOSIS — J338 Other polyp of sinus: Secondary | ICD-10-CM | POA: Diagnosis not present

## 2018-09-24 DIAGNOSIS — Z03818 Encounter for observation for suspected exposure to other biological agents ruled out: Secondary | ICD-10-CM | POA: Diagnosis not present

## 2018-09-24 DIAGNOSIS — J343 Hypertrophy of nasal turbinates: Secondary | ICD-10-CM | POA: Diagnosis not present

## 2018-09-29 ENCOUNTER — Ambulatory Visit: Payer: BC Managed Care – PPO | Admitting: Internal Medicine

## 2018-09-29 ENCOUNTER — Other Ambulatory Visit: Payer: Self-pay | Admitting: Otolaryngology

## 2018-09-29 ENCOUNTER — Encounter: Payer: Self-pay | Admitting: Internal Medicine

## 2018-09-29 VITALS — BP 90/70 | HR 104 | Temp 98.5°F | Ht 69.0 in | Wt 137.4 lb

## 2018-09-29 DIAGNOSIS — R634 Abnormal weight loss: Secondary | ICD-10-CM

## 2018-09-29 DIAGNOSIS — K219 Gastro-esophageal reflux disease without esophagitis: Secondary | ICD-10-CM

## 2018-09-29 DIAGNOSIS — R1010 Upper abdominal pain, unspecified: Secondary | ICD-10-CM | POA: Diagnosis not present

## 2018-09-29 DIAGNOSIS — I498 Other specified cardiac arrhythmias: Secondary | ICD-10-CM | POA: Diagnosis not present

## 2018-09-29 DIAGNOSIS — R000 Tachycardia, unspecified: Secondary | ICD-10-CM | POA: Diagnosis not present

## 2018-09-29 DIAGNOSIS — U071 COVID-19: Secondary | ICD-10-CM | POA: Diagnosis not present

## 2018-09-29 DIAGNOSIS — F419 Anxiety disorder, unspecified: Secondary | ICD-10-CM | POA: Diagnosis not present

## 2018-09-29 DIAGNOSIS — I319 Disease of pericardium, unspecified: Secondary | ICD-10-CM | POA: Diagnosis not present

## 2018-09-29 DIAGNOSIS — J329 Chronic sinusitis, unspecified: Secondary | ICD-10-CM

## 2018-09-29 DIAGNOSIS — I517 Cardiomegaly: Secondary | ICD-10-CM | POA: Diagnosis not present

## 2018-09-29 NOTE — Patient Instructions (Signed)
You have been scheduled for an abdominal ultrasound at [facility] (1st floor of hospital) on 10/02/2018 at 8:00am. Please arrive 15 minutes prior to your appointment for registration. Make certain not to have anything to eat or drink after midnight prior to your appointment. Should you need to reschedule your appointment, please contact radiology at [phone]. This test typically takes about 30 minutes to perform.   You have been scheduled for an endoscopy. Please follow written instructions given to you at your visit today. If you use inhalers (even only as needed), please bring them with you on the day of your procedure. Marland Kitchen
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
## 2018-10-02 ENCOUNTER — Encounter: Payer: Self-pay | Admitting: Internal Medicine

## 2018-10-02 ENCOUNTER — Ambulatory Visit (HOSPITAL_COMMUNITY)
Admission: RE | Admit: 2018-10-02 | Discharge: 2018-10-02 | Disposition: A | Discharge status: Home / Self Care | Payer: BC Managed Care – PPO | Source: Ambulatory Visit | Attending: Internal Medicine | Admitting: Internal Medicine

## 2018-10-02 ENCOUNTER — Other Ambulatory Visit: Payer: Self-pay

## 2018-10-02 DIAGNOSIS — R1010 Upper abdominal pain, unspecified: Secondary | ICD-10-CM | POA: Diagnosis not present

## 2018-10-02 DIAGNOSIS — R634 Abnormal weight loss: Secondary | ICD-10-CM

## 2018-10-02 DIAGNOSIS — K219 Gastro-esophageal reflux disease without esophagitis: Secondary | ICD-10-CM | POA: Diagnosis not present

## 2018-10-06 ENCOUNTER — Ambulatory Visit
Admission: RE | Admit: 2018-10-06 | Discharge: 2018-10-06 | Disposition: A | Discharge status: Home / Self Care | Payer: BC Managed Care – PPO | Source: Ambulatory Visit | Attending: Otolaryngology | Admitting: Otolaryngology

## 2018-10-06 ENCOUNTER — Telehealth: Payer: Self-pay

## 2018-10-06 ENCOUNTER — Other Ambulatory Visit: Payer: Self-pay

## 2018-10-06 DIAGNOSIS — J329 Chronic sinusitis, unspecified: Secondary | ICD-10-CM

## 2018-10-06 NOTE — Telephone Encounter (Signed)
Covid-19 screening questions   Do you now or have you had a fever in the last 14 days? NO   Do you have any respiratory symptoms of shortness of breath or cough now or in the last 14 days? NO   Do you have any family members or close contacts with diagnosed or suspected Covid-19 in the past 14 days? NO   Have you been tested for Covid-19 and found to be positive? YES, back in June. Patient states she is doing well.Laura Montgomery

## 2018-10-07 ENCOUNTER — Encounter: Payer: Self-pay | Admitting: Internal Medicine

## 2018-10-07 ENCOUNTER — Ambulatory Visit (AMBULATORY_SURGERY_CENTER): Payer: BC Managed Care – PPO | Admitting: Internal Medicine

## 2018-10-07 VITALS — BP 110/78 | HR 92 | Temp 98.3°F | Resp 10 | Ht 69.0 in | Wt 137.0 lb

## 2018-10-07 DIAGNOSIS — R1010 Upper abdominal pain, unspecified: Secondary | ICD-10-CM | POA: Diagnosis not present

## 2018-10-07 DIAGNOSIS — K219 Gastro-esophageal reflux disease without esophagitis: Secondary | ICD-10-CM

## 2018-10-07 DIAGNOSIS — R634 Abnormal weight loss: Secondary | ICD-10-CM

## 2018-10-07 MED ORDER — SODIUM CHLORIDE 0.9 % IV SOLN: 500.0000 mL | Freq: Once | INTRAVENOUS | Status: DC

## 2018-10-07 NOTE — Progress Notes (Signed)
Temp by Harley Alto, vs by courtney [facility]

## 2018-10-07 NOTE — Progress Notes (Signed)
Temperatures taken by June Bullock today, not [place]

## 2018-10-07 NOTE — Patient Instructions (Signed)
YOU HAD AN ENDOSCOPIC PROCEDURE TODAY AT THE [facility]:   Refer to the procedure report that was given to you for any specific questions about what was found during the examination.  If the procedure report does not answer your questions, please call your gastroenterologist to clarify.  If you requested that your care partner not be given the details of your procedure findings, then the procedure report has been included in a sealed envelope for you to review at your convenience later.  YOU SHOULD EXPECT: Some feelings of bloating in the abdomen. Passage of more gas than usual.  Walking can help get rid of the air that was put into your GI tract during the procedure and reduce the bloating. If you had a lower endoscopy (such as a colonoscopy or flexible sigmoidoscopy) you may notice spotting of blood in your stool or on the toilet paper. If you underwent a bowel prep for your procedure, you may not have a normal bowel movement for a few days.  Please Note:  You might notice some irritation and congestion in your nose or some drainage.  This is from the oxygen used during your procedure.  There is no need for concern and it should clear up in a day or so.  SYMPTOMS TO REPORT IMMEDIATELY:   Following upper endoscopy (EGD)  Vomiting of blood or coffee ground material  New chest pain or pain under the shoulder blades  Painful or persistently difficult swallowing  New shortness of breath  Fever of 100F or higher  Black, tarry-looking stools  For urgent or emergent issues, a gastroenterologist can be reached at any hour by calling [phone].   DIET:  We do recommend a small meal at first, but then you may proceed to your regular diet.  Drink plenty of fluids but you should avoid alcoholic beverages for 24 hours.  ACTIVITY:  You should plan to take it easy for the rest of today and you should NOT DRIVE or use heavy machinery until tomorrow (because of the sedation medicines used  during the test).    FOLLOW UP: Our staff will call the number listed on your records 48-72 hours following your procedure to check on you and address any questions or concerns that you may have regarding the information given to you following your procedure. If we do not reach you, we will leave a message.  We will attempt to reach you two times.  During this call, we will ask if you have developed any symptoms of COVID 19. If you develop any symptoms (ie: fever, flu-like symptoms, shortness of breath, cough etc.) before then, please call [phone].  If you test positive for Covid 19 in the 2 weeks post procedure, please call and report this information to us.    If any biopsies were taken you will be contacted by phone or by letter within the next 1-3 weeks.  Please call us at [phone] if you have not heard about the biopsies in 3 weeks.    SIGNATURES/CONFIDENTIALITY: You and/or your care partner have signed paperwork which will be entered into your electronic medical record.  These signatures attest to the fact that that the information above on your After Visit Summary has been reviewed and is understood.  Full responsibility of the confidentiality of this discharge information lies with you and/or your care-partner. 

## 2018-10-07 NOTE — Progress Notes (Signed)
PT taken to PACU. Monitors in place. VSS. Report given to RN. 

## 2018-10-07 NOTE — Op Note (Signed)
[facility] Patient Name: Yaire Mclanahan Procedure Date: 10/07/2018 10:48 AM MRN: [id number] Endoscopist: Docia Chuck. Henrene Pastor , MD Age: 43 Referring MD:  Date of Birth: 1975-02-23 Gender: Female Account #: [id number] Procedure:                Upper GI endoscopy Indications:              Dyspepsia, weight loss. Recent abdominal ultrasound                            was normal. Symptoms have not been affected by PPI                            therapy. Now for upper endoscopy Medicines:                Monitored Anesthesia Care Procedure:                Pre-Anesthesia Assessment:                           - Prior to the procedure, a History and Physical                            was performed, and patient medications and                            allergies were reviewed. The patient's tolerance of                            previous anesthesia was also reviewed. The risks                            and benefits of the procedure and the sedation                            options and risks were discussed with the patient.                            All questions were answered, and informed consent                            was obtained. Prior Anticoagulants: The patient has                            taken no previous anticoagulant or antiplatelet                            agents. ASA Grade Assessment: I - A normal, healthy                            patient. After reviewing the risks and benefits,                            the patient was deemed in satisfactory condition to  undergo the procedure.                           After obtaining informed consent, the endoscope was                            passed under direct vision. Throughout the                            procedure, the patient's blood pressure, pulse, and                            oxygen saturations were monitored continuously. The                            Endoscope was introduced  through the mouth, and                            advanced to the second part of duodenum. The upper                            GI endoscopy was accomplished without difficulty.                            The patient tolerated the procedure well. Scope In: Scope Out: Findings:                 The esophagus was normal.                           The stomach was normal.                           The examined duodenum was normal.                           The cardia and gastric fundus were normal on                            retroflexion. Complications:            No immediate complications. Estimated Blood Loss:     Estimated blood loss: none. Impression:               1. Normal upper endoscopy.                           2. No obvious GI disorders identified Recommendation:           1. Patient has a contact number available for                            emergencies. The signs and symptoms of potential                            delayed complications were discussed with the  patient. Return to normal activities tomorrow.                            Written discharge instructions were provided to the                            patient.                           2. Resume previous diet.                           3. Discontinue pantoprazole                           4. Return to the care of your primary provider. GI                            follow-up as needed. Docia Chuck. Henrene Pastor, MD 10/07/2018 10:59:18 AM This report has been signed electronically.

## 2018-10-08 DIAGNOSIS — Z0289 Encounter for other administrative examinations: Secondary | ICD-10-CM | POA: Diagnosis not present

## 2018-10-08 DIAGNOSIS — F419 Anxiety disorder, unspecified: Secondary | ICD-10-CM | POA: Diagnosis not present

## 2018-10-08 DIAGNOSIS — R002 Palpitations: Secondary | ICD-10-CM | POA: Diagnosis not present

## 2018-10-08 DIAGNOSIS — F411 Generalized anxiety disorder: Secondary | ICD-10-CM | POA: Diagnosis not present

## 2018-10-09 ENCOUNTER — Telehealth: Payer: Self-pay | Admitting: *Deleted

## 2018-10-09 NOTE — Telephone Encounter (Signed)
  Follow up Call-  Call back number 10/07/2018  Post procedure Call Back phone  # [phone]  Permission to leave phone message Yes  Some recent data might be hidden     Patient questions:  Do you have a fever, pain , or abdominal swelling? No. Pain Score  0 *  Have you tolerated food without any problems? Yes.    Have you been able to return to your normal activities? Yes.    Do you have any questions about your discharge instructions: Diet   No. Medications  No. Follow up visit  No.  Do you have questions or concerns about your Care? No.  Actions: * If pain score is 4 or above: No action needed, pain <4.  1. Have you developed a fever since your procedure? no  2.   Have you had an respiratory symptoms (SOB or cough) since your procedure? no  3.   Have you tested positive for COVID 19 since your procedure no  4.   Have you had any family members/close contacts diagnosed with the COVID 19 since your procedure?  no   If yes to any of these questions please route to Joylene John, RN and Alphonsa Gin, Therapist, sports.

## 2018-10-22 DIAGNOSIS — Z01419 Encounter for gynecological examination (general) (routine) without abnormal findings: Secondary | ICD-10-CM | POA: Diagnosis not present

## 2018-10-22 DIAGNOSIS — Z681 Body mass index (BMI) 19 or less, adult: Secondary | ICD-10-CM | POA: Diagnosis not present

## 2018-10-24 DIAGNOSIS — J310 Chronic rhinitis: Secondary | ICD-10-CM | POA: Diagnosis not present

## 2018-10-24 DIAGNOSIS — J343 Hypertrophy of nasal turbinates: Secondary | ICD-10-CM | POA: Diagnosis not present

## 2018-10-24 DIAGNOSIS — J338 Other polyp of sinus: Secondary | ICD-10-CM | POA: Diagnosis not present

## 2018-10-30 DIAGNOSIS — Z23 Encounter for immunization: Secondary | ICD-10-CM | POA: Diagnosis not present

## 2018-10-30 DIAGNOSIS — Z0000 Encounter for general adult medical examination without abnormal findings: Secondary | ICD-10-CM | POA: Diagnosis not present

## 2018-10-30 DIAGNOSIS — D509 Iron deficiency anemia, unspecified: Secondary | ICD-10-CM | POA: Diagnosis not present

## 2018-11-03 DIAGNOSIS — F411 Generalized anxiety disorder: Secondary | ICD-10-CM | POA: Diagnosis not present

## 2018-11-04 DIAGNOSIS — R222 Localized swelling, mass and lump, trunk: Secondary | ICD-10-CM | POA: Diagnosis not present

## 2018-11-06 DIAGNOSIS — Z01419 Encounter for gynecological examination (general) (routine) without abnormal findings: Secondary | ICD-10-CM | POA: Diagnosis not present

## 2018-11-06 DIAGNOSIS — Z1151 Encounter for screening for human papillomavirus (HPV): Secondary | ICD-10-CM | POA: Diagnosis not present

## 2018-11-10 DIAGNOSIS — F411 Generalized anxiety disorder: Secondary | ICD-10-CM | POA: Diagnosis not present

## 2018-11-17 DIAGNOSIS — F411 Generalized anxiety disorder: Secondary | ICD-10-CM | POA: Diagnosis not present

## 2018-12-01 DIAGNOSIS — J3089 Other allergic rhinitis: Secondary | ICD-10-CM | POA: Diagnosis not present

## 2018-12-01 DIAGNOSIS — F411 Generalized anxiety disorder: Secondary | ICD-10-CM | POA: Diagnosis not present

## 2018-12-01 DIAGNOSIS — J301 Allergic rhinitis due to pollen: Secondary | ICD-10-CM | POA: Diagnosis not present

## 2018-12-05 DIAGNOSIS — F411 Generalized anxiety disorder: Secondary | ICD-10-CM | POA: Diagnosis not present

## 2018-12-08 DIAGNOSIS — J301 Allergic rhinitis due to pollen: Secondary | ICD-10-CM | POA: Diagnosis not present

## 2018-12-09 DIAGNOSIS — J3089 Other allergic rhinitis: Secondary | ICD-10-CM | POA: Diagnosis not present

## 2018-12-22 DIAGNOSIS — F411 Generalized anxiety disorder: Secondary | ICD-10-CM | POA: Diagnosis not present

## 2018-12-22 DIAGNOSIS — J3089 Other allergic rhinitis: Secondary | ICD-10-CM | POA: Diagnosis not present

## 2018-12-22 DIAGNOSIS — J301 Allergic rhinitis due to pollen: Secondary | ICD-10-CM | POA: Diagnosis not present

## 2018-12-24 DIAGNOSIS — J3089 Other allergic rhinitis: Secondary | ICD-10-CM | POA: Diagnosis not present

## 2018-12-24 DIAGNOSIS — J301 Allergic rhinitis due to pollen: Secondary | ICD-10-CM | POA: Diagnosis not present

## 2018-12-26 DIAGNOSIS — D251 Intramural leiomyoma of uterus: Secondary | ICD-10-CM | POA: Diagnosis not present

## 2018-12-29 DIAGNOSIS — J3089 Other allergic rhinitis: Secondary | ICD-10-CM | POA: Diagnosis not present

## 2018-12-29 DIAGNOSIS — F411 Generalized anxiety disorder: Secondary | ICD-10-CM | POA: Diagnosis not present

## 2018-12-29 DIAGNOSIS — J301 Allergic rhinitis due to pollen: Secondary | ICD-10-CM | POA: Diagnosis not present

## 2019-01-01 DIAGNOSIS — J3081 Allergic rhinitis due to animal (cat) (dog) hair and dander: Secondary | ICD-10-CM | POA: Diagnosis not present

## 2019-01-01 DIAGNOSIS — J301 Allergic rhinitis due to pollen: Secondary | ICD-10-CM | POA: Diagnosis not present

## 2019-01-01 DIAGNOSIS — J3089 Other allergic rhinitis: Secondary | ICD-10-CM | POA: Diagnosis not present

## 2019-01-05 DIAGNOSIS — J3089 Other allergic rhinitis: Secondary | ICD-10-CM | POA: Diagnosis not present

## 2019-01-05 DIAGNOSIS — J301 Allergic rhinitis due to pollen: Secondary | ICD-10-CM | POA: Diagnosis not present

## 2019-01-12 DIAGNOSIS — J301 Allergic rhinitis due to pollen: Secondary | ICD-10-CM | POA: Diagnosis not present

## 2019-01-12 DIAGNOSIS — J3089 Other allergic rhinitis: Secondary | ICD-10-CM | POA: Diagnosis not present

## 2019-01-12 DIAGNOSIS — F411 Generalized anxiety disorder: Secondary | ICD-10-CM | POA: Diagnosis not present

## 2019-03-14 ENCOUNTER — Encounter (HOSPITAL_COMMUNITY): Payer: Self-pay | Admitting: Emergency Medicine

## 2019-03-14 ENCOUNTER — Other Ambulatory Visit: Payer: Self-pay

## 2019-03-14 ENCOUNTER — Emergency Department (HOSPITAL_COMMUNITY)
Admission: EM | Admit: 2019-03-14 | Discharge: 2019-03-15 | Disposition: A | Discharge status: Home / Self Care | Payer: BC Managed Care – PPO | Attending: Emergency Medicine | Admitting: Emergency Medicine

## 2019-03-14 DIAGNOSIS — Z79899 Other long term (current) drug therapy: Secondary | ICD-10-CM | POA: Insufficient documentation

## 2019-03-14 DIAGNOSIS — K068 Other specified disorders of gingiva and edentulous alveolar ridge: Secondary | ICD-10-CM | POA: Diagnosis present

## 2019-03-14 LAB — CBC WITH DIFFERENTIAL/PLATELET
Abs Immature Granulocytes: 0.02 10*3/uL (ref 0.00–0.07)
Basophils Absolute: 0 10*3/uL (ref 0.0–0.1)
Basophils Relative: 1 %
Eosinophils Absolute: 0.1 10*3/uL (ref 0.0–0.5)
Eosinophils Relative: 1 %
HCT: 34.8 % — ABNORMAL LOW (ref 36.0–46.0)
Hemoglobin: 11.6 g/dL — ABNORMAL LOW (ref 12.0–15.0)
Immature Granulocytes: 0 %
Lymphocytes Relative: 27 %
Lymphs Abs: 2.1 10*3/uL (ref 0.7–4.0)
MCH: 32.2 pg (ref 26.0–34.0)
MCHC: 33.3 g/dL (ref 30.0–36.0)
MCV: 96.7 fL (ref 80.0–100.0)
Monocytes Absolute: 0.9 10*3/uL (ref 0.1–1.0)
Monocytes Relative: 12 %
Neutro Abs: 4.7 10*3/uL (ref 1.7–7.7)
Neutrophils Relative %: 59 %
Platelets: 273 10*3/uL (ref 150–400)
RBC: 3.6 MIL/uL — ABNORMAL LOW (ref 3.87–5.11)
RDW: 11.9 % (ref 11.5–15.5)
WBC: 7.9 10*3/uL (ref 4.0–10.5)
nRBC: 0 % (ref 0.0–0.2)

## 2019-03-14 LAB — BASIC METABOLIC PANEL
Anion gap: 12 (ref 5–15)
BUN: 13 mg/dL (ref 6–20)
CO2: 22 mmol/L (ref 22–32)
Calcium: 9.8 mg/dL (ref 8.9–10.3)
Chloride: 100 mmol/L (ref 98–111)
Creatinine, Ser: 0.86 mg/dL (ref 0.44–1.00)
GFR calc Af Amer: >60 mL/min (ref >60)
GFR calc non Af Amer: >60 mL/min (ref >60)
Glucose, Bld: 116 mg/dL — ABNORMAL HIGH (ref 70–99)
Potassium: 3.6 mmol/L (ref 3.5–5.1)
Sodium: 134 mmol/L — ABNORMAL LOW (ref 135–145)

## 2019-03-14 LAB — PROTIME-INR
INR: 1.1 (ref 0.8–1.2)
Prothrombin Time: 13.7 seconds (ref 11.4–15.2)

## 2019-03-14 NOTE — ED Triage Notes (Signed)
Patient reports bleeding at upper incisors this morning , dental procedure done Thursday this week .

## 2019-03-15 ENCOUNTER — Other Ambulatory Visit: Payer: Self-pay

## 2019-03-15 MED ORDER — LIDOCAINE-EPINEPHRINE (PF) 2 %-1:200000 IJ SOLN
20.0000 mL | Freq: Once | INTRAMUSCULAR | Status: AC
  Administered 2019-03-15: 20 mL

## 2019-03-15 MED ORDER — LIDOCAINE-EPINEPHRINE (PF) 2 %-1:200000 IJ SOLN
INTRAMUSCULAR | Status: AC
  Filled 2019-03-15: qty 20

## 2019-03-15 MED ORDER — "TRANEXAMIC ACID 5% ORAL SOLUTION "
10.0000 mL | Freq: Once | ORAL | Status: AC
  Administered 2019-03-15: 01:00:00 10 mL via OROMUCOSAL
  Filled 2019-03-15: qty 10

## 2019-03-15 MED ORDER — "TRANEXAMIC ACID 5% ORAL SOLUTION "
10.0000 mL | Freq: Once | ORAL | Status: AC
  Administered 2019-03-15: 10 mL via OROMUCOSAL
  Filled 2019-03-15: qty 10
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
## 2019-03-15 NOTE — ED Triage Notes (Signed)
Pt reports having crown lengthening procedure from orthodontist. Pt is now having excessive bloody salivation.

## 2019-03-15 NOTE — Discharge Instructions (Addendum)
You were seen today for bleeding gums following a dental procedure.  Continue soft diet.  Follow-up with your dentist.  If you note recurrent bleeding, swish with ice cold water.

## 2019-05-06 ENCOUNTER — Other Ambulatory Visit: Payer: Self-pay | Admitting: Otolaryngology

## 2019-06-16 ENCOUNTER — Other Ambulatory Visit (HOSPITAL_COMMUNITY): Payer: BC Managed Care – PPO

## 2019-06-19 ENCOUNTER — Encounter (HOSPITAL_BASED_OUTPATIENT_CLINIC_OR_DEPARTMENT_OTHER): Admission: RE | Payer: Self-pay | Source: Home / Self Care

## 2019-06-19 ENCOUNTER — Ambulatory Visit (HOSPITAL_BASED_OUTPATIENT_CLINIC_OR_DEPARTMENT_OTHER): Admission: RE | Admit: 2019-06-19 | Payer: BC Managed Care – PPO | Source: Home / Self Care | Admitting: Otolaryngology

## 2019-06-19 SURGERY — SEPTOPLASTY, NOSE, WITH NASAL TURBINATE REDUCTION
Anesthesia: General | Laterality: Bilateral

## 2020-08-23 IMAGING — CT CT MAXILLOFACIAL W/O CM
1 series · 15 of 30 positions shown, 19 images · non-contrast
Comparison: 05/19/2013

CLINICAL DATA: Chronic sinusitis with pain, swelling, pressure, and
polyps.

EXAM:
CT MAXILLOFACIAL WITHOUT CONTRAST
TECHNIQUE: Multidetector CT images of the paranasal sinuses were obtained using
the standard protocol without intravenous contrast.

[Series 4: soft tissue · axial · 0.37mm/px · z∈[-186,-63]mm · 15 of 133 slices shown, 19 images]
[im 5/133  brain]
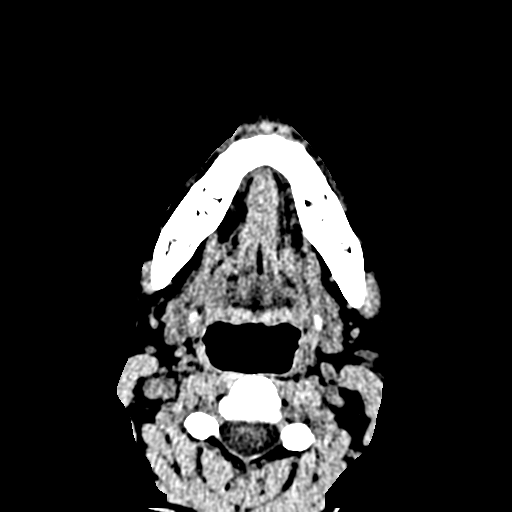
[im 5/133  bone]
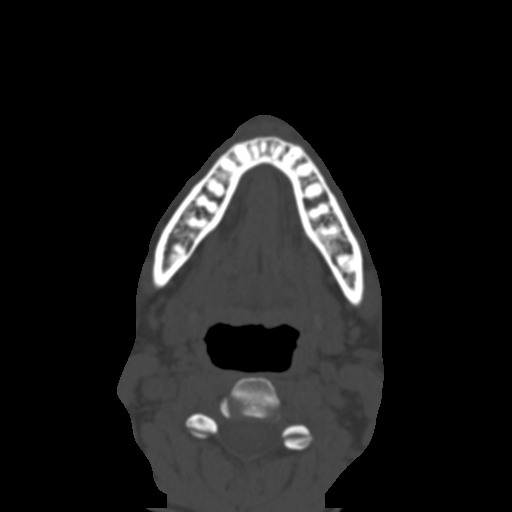
[im 14/133  bone]
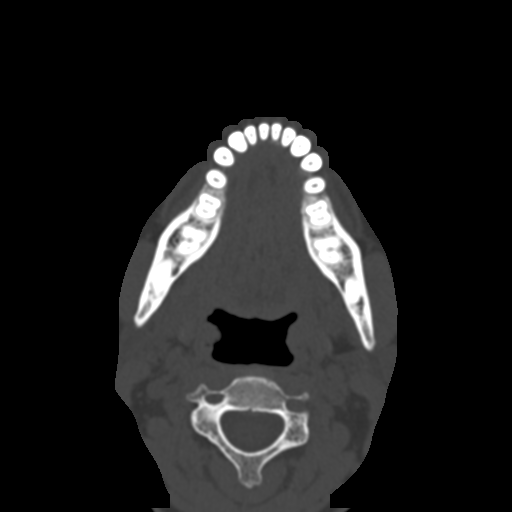
[im 23/133  bone]
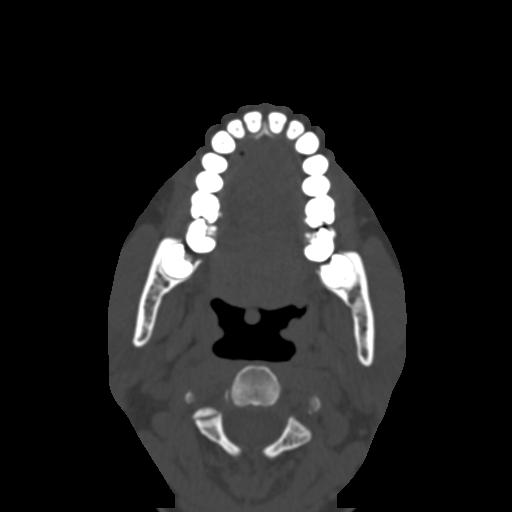
[im 32/133  bone]
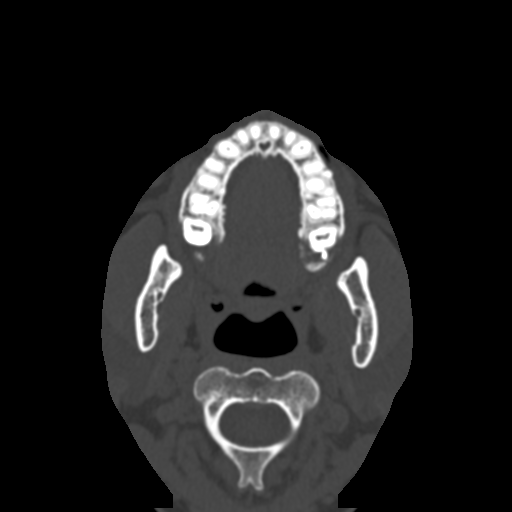
[im 41/133  brain]
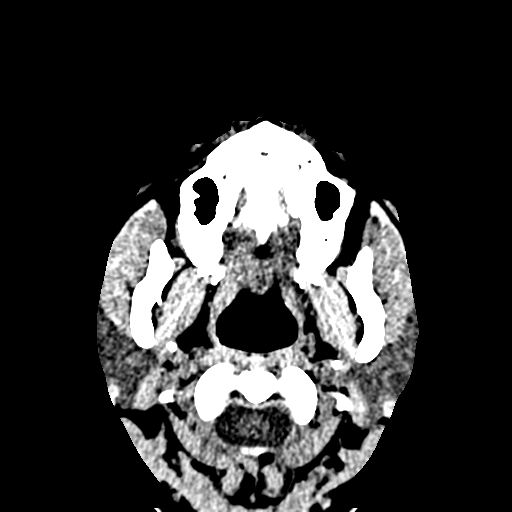
[im 41/133  bone]
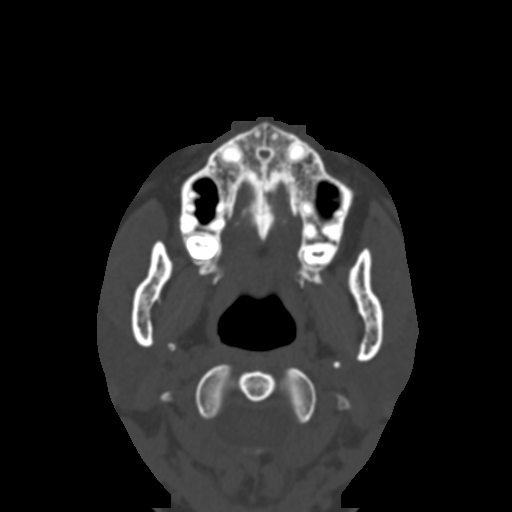
[im 51/133  bone]
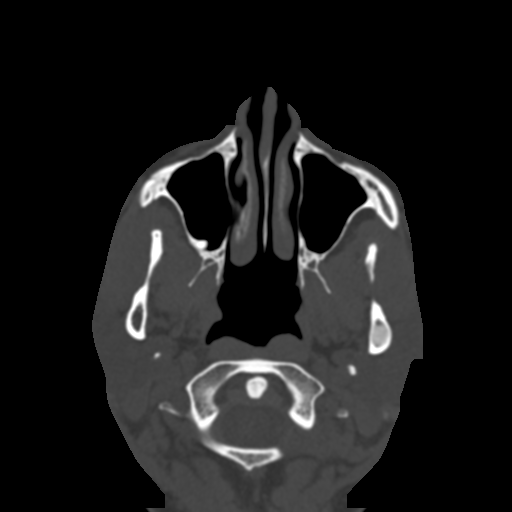
[im 60/133  bone]
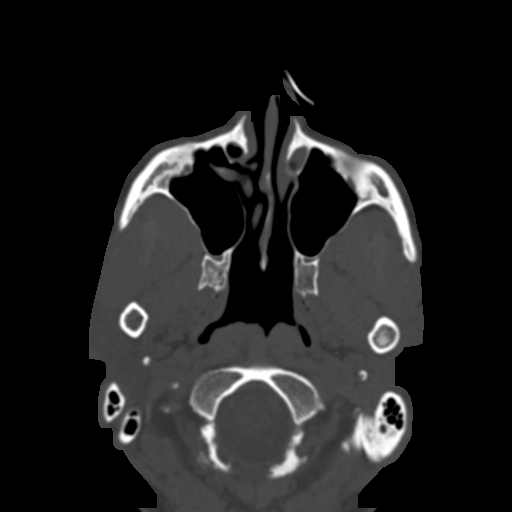
[im 69/133  bone]
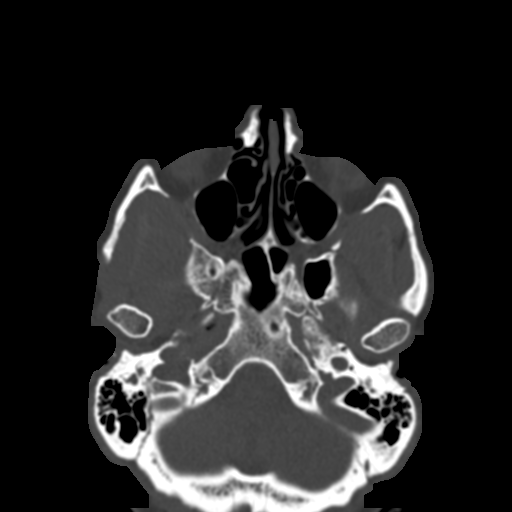
[im 73/133  brain]
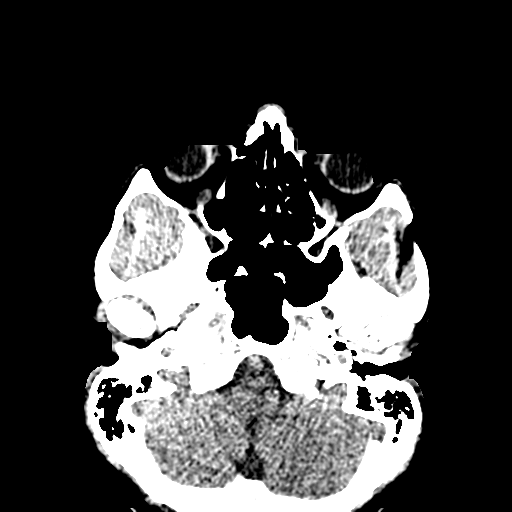
[im 73/133  bone]
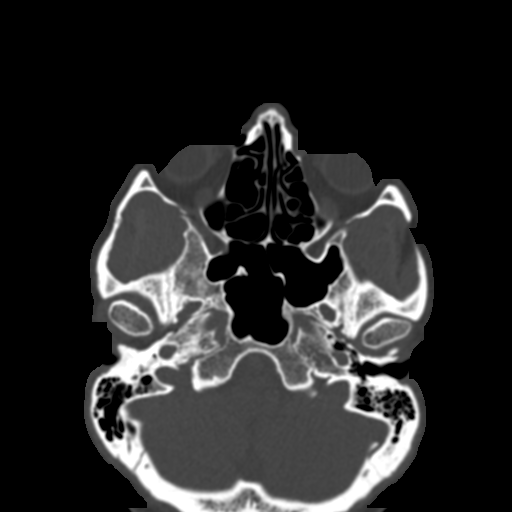
[im 82/133  bone]
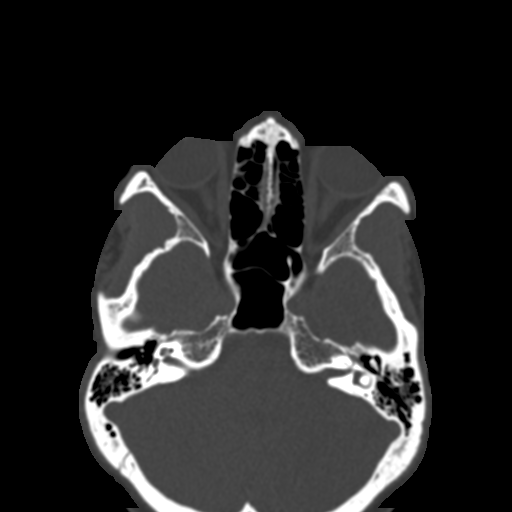
[im 92/133  bone]
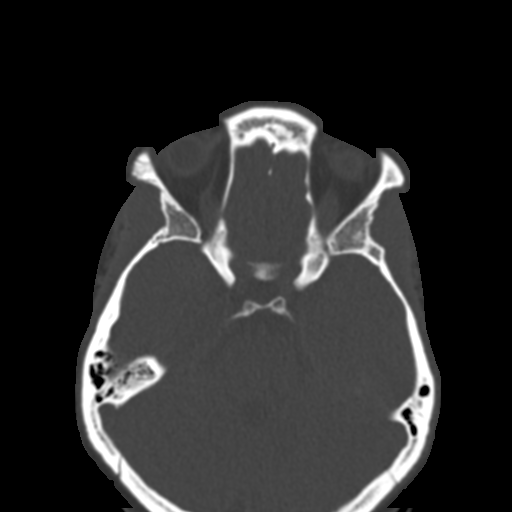
[im 101/133  bone]
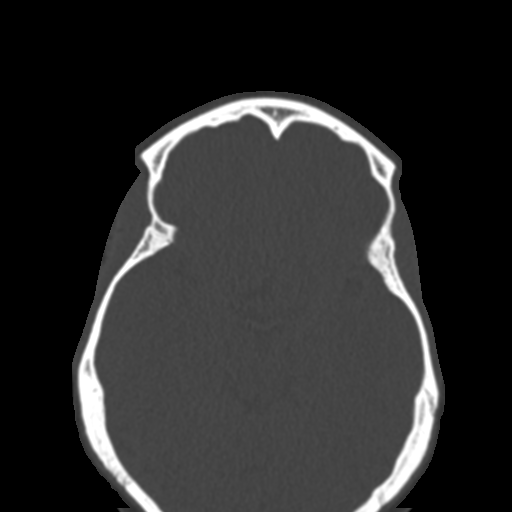
[im 110/133  brain]
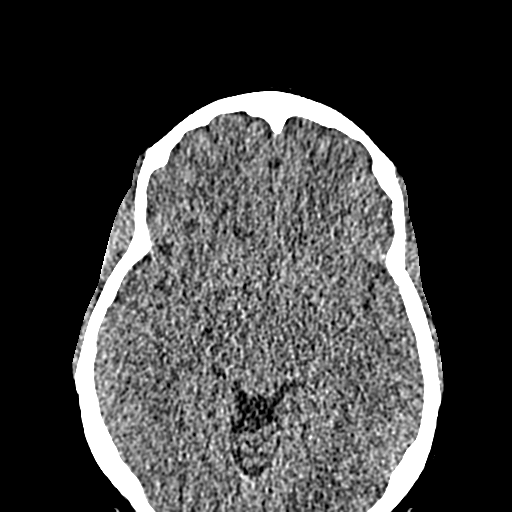
[im 110/133  bone]
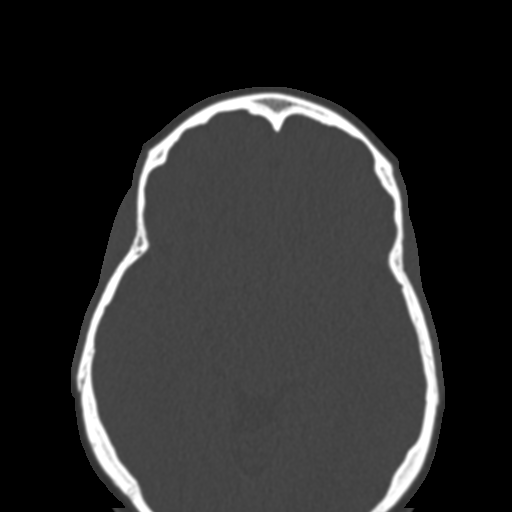
[im 119/133  bone]
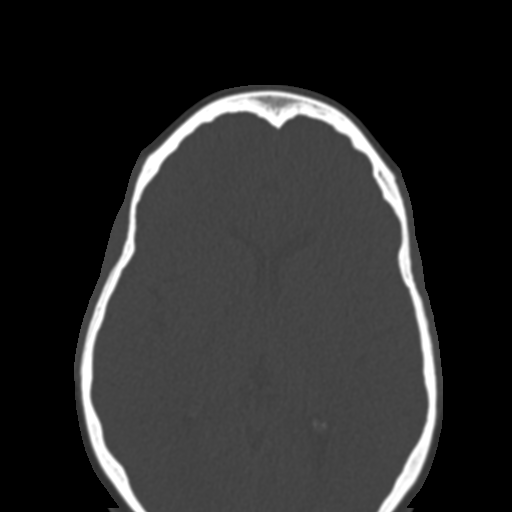
[im 128/133  bone]
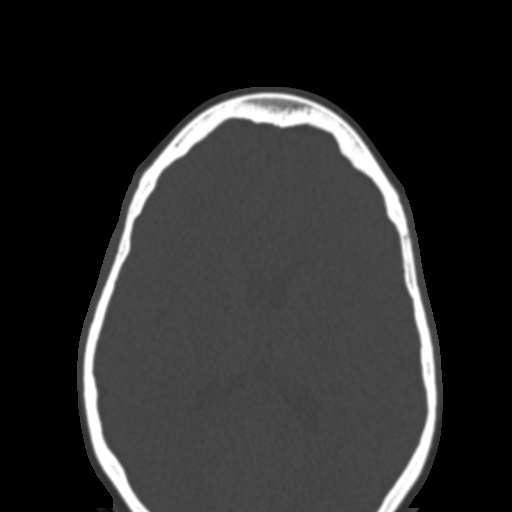

[15 of 30 positions shown; findings below may reference images not displayed]

FINDINGS: Paranasal sinuses:

Frontal: Aplastic.

Ethmoid: Normally aerated.

Maxillary: Normally aerated.

Sphenoid: Normally aerated. Patent sphenoethmoidal recesses.

Right ostiomeatal unit: Enlarged ethmoid bulla with inferior
displacement of the uncinate process without obstruction.

Left ostiomeatal unit: Patent.

Nasal passages: Patent. Hypoplastic/atrophic appearance of the
middle turbinates. Intact nasal septum with unchanged 3 mm of
leftward nasal septal deviation with a small rightward projecting
septal spur.

Anatomy: No pneumatization superior to anterior ethmoid notches.
Intact olfactory grooves and fovea ethmoidalis, Keros I/II. Sellar
sphenoid pneumatization pattern. No dehiscence of carotid or optic
canals. No onodi cell.

Other: Clear mastoid air cells and tympanic cavities. Unremarkable
appearance of the orbits and included portion of the brain.
IMPRESSION: Clear sinuses.
